# Patient Record
Sex: Female | Born: 1970 | Race: White | Hispanic: No | Marital: Married | Smoking: Never smoker
Health system: Southern US, Community
[De-identification: ages and names within clinical notes are randomized; demographics above are authoritative.]

## PROBLEM LIST (undated history)

## (undated) DIAGNOSIS — F419 Anxiety disorder, unspecified: Secondary | ICD-10-CM

## (undated) DIAGNOSIS — E781 Pure hyperglyceridemia: Secondary | ICD-10-CM

## (undated) DIAGNOSIS — E559 Vitamin D deficiency, unspecified: Secondary | ICD-10-CM

## (undated) DIAGNOSIS — K760 Fatty (change of) liver, not elsewhere classified: Secondary | ICD-10-CM

## (undated) DIAGNOSIS — K5792 Diverticulitis of intestine, part unspecified, without perforation or abscess without bleeding: Secondary | ICD-10-CM

## (undated) DIAGNOSIS — R7303 Prediabetes: Secondary | ICD-10-CM

## (undated) DIAGNOSIS — E039 Hypothyroidism, unspecified: Secondary | ICD-10-CM

## (undated) HISTORY — DX: Diverticulitis of intestine, part unspecified, without perforation or abscess without bleeding: K57.92

## (undated) HISTORY — DX: Anxiety disorder, unspecified: F41.9

## (undated) HISTORY — DX: Pure hyperglyceridemia: E78.1

## (undated) HISTORY — PX: TUBAL LIGATION: SHX77

## (undated) HISTORY — DX: Vitamin D deficiency, unspecified: E55.9

---

## 2011-10-29 DIAGNOSIS — R109 Unspecified abdominal pain: Secondary | ICD-10-CM | POA: Insufficient documentation

## 2015-12-03 ENCOUNTER — Emergency Department
Admission: EM | Admit: 2015-12-03 | Discharge: 2015-12-04 | Disposition: A | Discharge status: Home / Self Care | Payer: BC Managed Care – PPO | Attending: Emergency Medicine | Admitting: Emergency Medicine

## 2015-12-03 DIAGNOSIS — Y9389 Activity, other specified: Secondary | ICD-10-CM | POA: Insufficient documentation

## 2015-12-03 DIAGNOSIS — M436 Torticollis: Secondary | ICD-10-CM | POA: Diagnosis present

## 2015-12-03 DIAGNOSIS — Y92003 Bedroom of unspecified non-institutional (private) residence as the place of occurrence of the external cause: Secondary | ICD-10-CM | POA: Insufficient documentation

## 2015-12-03 DIAGNOSIS — M5412 Radiculopathy, cervical region: Secondary | ICD-10-CM | POA: Diagnosis not present

## 2015-12-03 DIAGNOSIS — Y999 Unspecified external cause status: Secondary | ICD-10-CM | POA: Diagnosis not present

## 2015-12-03 DIAGNOSIS — S161XXA Strain of muscle, fascia and tendon at neck level, initial encounter: Secondary | ICD-10-CM | POA: Diagnosis not present

## 2015-12-03 DIAGNOSIS — X501XXA Overexertion from prolonged static or awkward postures, initial encounter: Secondary | ICD-10-CM | POA: Diagnosis not present

## 2015-12-03 MED ORDER — KETOROLAC TROMETHAMINE 30 MG/ML IJ SOLN
30.0000 mg | Freq: Once | INTRAMUSCULAR | Status: AC
  Administered 2015-12-04: 30 mg via INTRAMUSCULAR
  Filled 2015-12-03: qty 1

## 2015-12-03 MED ORDER — NAPROXEN 500 MG PO TABS: 500.0000 mg | ORAL_TABLET | Freq: Two times a day (BID) | ORAL | Status: AC

## 2015-12-03 MED ORDER — METAXALONE 800 MG PO TABS: 800.0000 mg | ORAL_TABLET | Freq: Three times a day (TID) | ORAL | Status: AC

## 2015-12-03 MED ORDER — OXYCODONE-ACETAMINOPHEN 5-325 MG PO TABS
1.0000 | ORAL_TABLET | Freq: Four times a day (QID) | ORAL | Status: DC | PRN
Start: 2015-12-03 — End: 2018-08-04

## 2015-12-03 NOTE — ED Notes (Addendum)
Pt in with co right neck pain radiates to right arm with tingling to fingers.  Has been sitting and typing a lot recently does have hx of back problems.

## 2015-12-03 NOTE — ED Provider Notes (Signed)
[facility] Emergency Department Provider Note ____________________________________________  Time seen: Approximately 11:34 PM  I have reviewed the triage vital signs and the nursing notes.   HISTORY  Chief Complaint Torticollis    HPI Hayley Mills is a 45 y.o. female who presents to the emergency department tonight for evaluation of pain in her neck on the right side with radiation down her right arm and into her hand. She states that earlier the pain also radiated up into her scalp and around into her right jaw. 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 She states that she has not been doing anything physically taxing, but does state that she has been under a great amount of stress. She states that she took a hydrocodone but feels that this made her have an anxiety attack. She states that when she arrived to the emergency department she had very high blood pressure compared to her normal and her heart felt like it was beating out of her chest.  No past medical history on file.  There are no active problems to display for this patient.   No past surgical history on file.  Current Outpatient Rx  Name  Route  Sig  Dispense  Refill  . metaxalone (SKELAXIN) 800 MG tablet   Oral   Take 1 tablet (800 mg total) by mouth 3 (three) times daily.   90 tablet   0   . naproxen (NAPROSYN) 500 MG tablet   Oral   Take 1 tablet (500 mg total) by mouth 2 (two) times daily with a meal.   60 tablet   0   . oxyCODONE-acetaminophen (ROXICET) 5-325 MG tablet   Oral   Take 1 tablet by mouth every 6 (six) hours as needed.   12 tablet   0     Allergies Review of patient's allergies indicates no known allergies.  No family history on file.  Social History Social History  Substance Use Topics  . Smoking status: Not on file  . Smokeless tobacco: Not on file  . Alcohol  Use: Not on file    Review of Systems Constitutional: No recent illness.Positive for great amount of stress. Eyes: No visual changes. ENT: No sore throat. Cardiovascular: Denies chest pain or palpitations at time of evaluation. Respiratory: Denies shortness of breath. Gastrointestinal: No abdominal pain.  Musculoskeletal: Pain in neck Skin: Negative for rash. Neurological: Negative for headaches, focal weakness or numbness.  ____________________________________________   PHYSICAL EXAM:  VITAL SIGNS: ED Triage Vitals  Enc Vitals Group     BP 12/03/15 2123 139/89 mmHg     Pulse Rate 12/03/15 2123 80     Resp 12/03/15 2123 18     Temp 12/03/15 2123 98.2 F (36.8 C)     Temp Source 12/03/15 2123 Oral     SpO2 12/03/15 2123 99 %     Weight 12/03/15 2123 165 lb (74.844 kg)     Height 12/03/15 2123 5\' 7"  (1.702 m)     Head Cir --      Peak Flow --      Pain Score 12/03/15 2124 9     Pain Loc --      Pain Edu? --      Excl. in [place]? --     Constitutional: Alert and oriented. Well appearing and in no acute distress. Eyes: Conjunctivae are normal. EOMI. Head: Atraumatic. Nose:  No congestion/rhinnorhea. Neck: No stridor.  Respiratory: Normal respiratory effort.   Musculoskeletal: Tenderness to palpation over the cervical paraspinal muscles. Pain increases with internal rotation of the shoulders. Neurologic:  Normal speech and language. No gross focal neurologic deficits are appreciated. Speech is normal. No gait instability. Skin:  Skin is warm, dry and intact. Atraumatic. Psychiatric: Mood and affect are normal. Speech and behavior are normal.  ____________________________________________   LABS (all labs ordered are listed, but only abnormal results are displayed)  Labs Reviewed - No data to display ____________________________________________  RADIOLOGY  Not indicated ____________________________________________   PROCEDURES  Procedure(s) performed:  None   ____________________________________________   INITIAL IMPRESSION / ASSESSMENT AND PLAN / ED COURSE  Pertinent labs & imaging results that were available during my care of the patient were reviewed by me and considered in my medical decision making (see chart for details).  Patient symptoms consistent with cervical strain and cervical radiculopathy. She was given Toradol 30 mg IM while in the emergency department. She was advised to take Naprosyn 500 mg twice a day as well as Skelaxin as prescribed. She was advised to avoid sitting in a hunched over position for long periods of time. She is advised to return to the emergency department for symptoms that change or worsen if she is unable to schedule an appointment with her primary care provider. ____________________________________________   FINAL CLINICAL IMPRESSION(S) / ED DIAGNOSES  Final diagnoses:  Cervical radiculopathy  Cervical strain, acute, initial encounter       Victorino Dike, FNP 12/03/15 2344  Hinda Kehr, MD 12/03/15 2350

## 2015-12-03 NOTE — Discharge Instructions (Signed)
Cervical Radiculopathy Cervical radiculopathy happens when a nerve in the neck (cervical nerve) is pinched or bruised. This condition can develop because of an injury or as part of the normal aging process. Pressure on the cervical nerves can cause pain or numbness that runs from the neck all the way down into the arm and fingers. Usually, this condition gets better with rest. Treatment may be needed if the condition does not improve.  CAUSES This condition may be caused by:  Injury.  Slipped (herniated) disk.  Muscle tightness in the neck because of overuse.  Arthritis.  Breakdown or degeneration in the bones and joints of the spine (spondylosis) due to aging.  Bone spurs that may develop near the cervical nerves. SYMPTOMS Symptoms of this condition include:  Pain that runs from the neck to the arm and hand. The pain can be severe or irritating. It may be worse when the neck is moved.  Numbness or weakness in the affected arm and hand. DIAGNOSIS This condition may be diagnosed based on symptoms, medical history, and a physical exam. You may also have tests, including:  X-rays.  CT scan.  MRI.  Electromyogram (EMG).  Nerve conduction tests. TREATMENT In many cases, treatment is not needed for this condition. With rest, the condition usually gets better over time. If treatment is needed, options may include:  Wearing a soft neck collar for short periods of time.  Physical therapy to strengthen your neck muscles.  Medicines, such as NSAIDs, oral corticosteroids, or spinal injections.  Surgery. This may be needed if other treatments do not help. Various types of surgery may be done depending on the cause of your problems. HOME CARE INSTRUCTIONS Managing Pain  Take over-the-counter and prescription medicines only as told by your health care provider.  If directed, apply ice to the affected area.  Put ice in a plastic bag.  Place a towel between your skin and the  bag.  Leave the ice on for 20 minutes, 2-3 times per day.  If ice does not help, you can try using heat. Take a warm shower or warm bath, or use a heat pack as told by your health care provider.  Try a gentle neck and shoulder massage to help relieve symptoms. Activity  Rest as needed. Follow instructions from your health care provider about any restrictions on activities.  Do stretching and strengthening exercises as told by your health care provider or physical therapist. General Instructions  If you were given a soft collar, wear it as told by your health care provider.  Use a flat pillow when you sleep.  Keep all follow-up visits as told by your health care provider. This is important. SEEK MEDICAL CARE IF:  Your condition does not improve with treatment. SEEK IMMEDIATE MEDICAL CARE IF:  Your pain gets much worse and cannot be controlled with medicines.  You have weakness or numbness in your hand, arm, face, or leg.  You have a high fever.  You have a stiff, rigid neck.  You lose control of your bowels or your bladder (have incontinence).  You have trouble with walking, balance, or speaking.   This information is not intended to replace advice given to you by your health care provider. Make sure you discuss any questions you have with your health care provider.   Document Released: 05/20/2001 Document Revised: 05/16/2015 Document Reviewed: 10/19/2014 Elsevier Interactive Patient Education 2016 Elsevier [employer]  Acute Torticollis Torticollis is a condition in which the muscles of the neck  tighten (contract) abnormally, causing the neck to twist and the head to move into an unnatural position. Torticollis that develops suddenly is called acute torticollis. If torticollis becomes chronic and is left untreated, the face and neck can become deformed. CAUSES This condition may be caused by:  Sleeping in an awkward position (common).  Extending or twisting the neck muscles  beyond their normal position.  Infection. In some cases, the cause may not be known. SYMPTOMS Symptoms of this condition include:  An unnatural position of the head.  Neck pain.  A limited ability to move the neck.  Twisting of the neck to one side. DIAGNOSIS This condition is diagnosed with a physical exam. You may also have imaging tests, such as an X-ray, CT scan, or MRI. TREATMENT Treatment for this condition involves trying to relax the neck muscles. It may include:  Medicines or shots.  Physical therapy.  Surgery. This may be done in severe cases. HOME CARE INSTRUCTIONS  Take medicines only as directed by your health care provider.  Do stretching exercises and massage your neck as directed by your health care provider.  Keep all follow-up visits as directed by your health care provider. This is important. SEEK MEDICAL CARE IF:  You develop a fever. SEEK IMMEDIATE MEDICAL CARE IF:  You develop difficulty breathing.  You develop noisy breathing (stridor).  You start drooling.  You have trouble swallowing or have pain with swallowing.  You develop numbness or weakness in your hands or feet.  You have changes in your speech, understanding, or vision.  Your pain gets worse.   This information is not intended to replace advice given to you by your health care provider. Make sure you discuss any questions you have with your health care provider.   Document Released: 08/22/2000 Document Revised: 01/09/2015 Document Reviewed: 08/21/2014 Elsevier Interactive Patient Education [employer].

## 2017-04-02 ENCOUNTER — Encounter: Payer: Self-pay | Admitting: Obstetrics & Gynecology

## 2017-04-02 ENCOUNTER — Ambulatory Visit: Payer: Self-pay | Admitting: Obstetrics & Gynecology

## 2017-04-02 ENCOUNTER — Ambulatory Visit (INDEPENDENT_AMBULATORY_CARE_PROVIDER_SITE_OTHER): Payer: BC Managed Care – PPO | Admitting: Obstetrics & Gynecology

## 2017-04-02 VITALS — BP 110/70 | HR 93 | Ht 67.0 in | Wt 196.0 lb

## 2017-04-02 DIAGNOSIS — R1032 Left lower quadrant pain: Secondary | ICD-10-CM | POA: Diagnosis not present

## 2017-04-02 DIAGNOSIS — Z1211 Encounter for screening for malignant neoplasm of colon: Secondary | ICD-10-CM | POA: Diagnosis not present

## 2017-04-02 DIAGNOSIS — Z01419 Encounter for gynecological examination (general) (routine) without abnormal findings: Secondary | ICD-10-CM | POA: Diagnosis not present

## 2017-04-02 DIAGNOSIS — Z1231 Encounter for screening mammogram for malignant neoplasm of breast: Secondary | ICD-10-CM

## 2017-04-02 DIAGNOSIS — Z0000 Encounter for general adult medical examination without abnormal findings: Secondary | ICD-10-CM

## 2017-04-02 DIAGNOSIS — Z124 Encounter for screening for malignant neoplasm of cervix: Secondary | ICD-10-CM

## 2017-04-02 DIAGNOSIS — Z1239 Encounter for other screening for malignant neoplasm of breast: Secondary | ICD-10-CM

## 2017-04-02 MED ORDER — LEVOTHYROXINE SODIUM 50 MCG PO TABS: 50.0000 ug | ORAL_TABLET | Freq: Every day | ORAL | 11 refills | Status: DC

## 2017-04-02 NOTE — Patient Instructions (Signed)
PAP every three years Mammogram every year    Call [phone] to schedule at [facility] Colonoscopy every 10 years Labs next year

## 2017-04-02 NOTE — Progress Notes (Addendum)
HPI:      Hayley Mills is a 46 y.o. [phone] who LMP was Patient's last menstrual period was 03/20/2017., she presents today for her annual examination. The patient has no complaints today. The patient is sexually active. Her last pap: approximate date 2015 and was normal and last mammogram: approximate date 2017 and was normal. The patient does perform self breast exams.  There is no notable family history of breast or ovarian cancer in her family.  The patient has regular exercise: yes.  The patient denies current symptoms of depression.    GYN History: Contraception: tubal ligation  PMHx: Past Medical History:  Diagnosis Date  . Anxiety   . Hypertriglyceridemia   . Vitamin D deficiency    Past Surgical History:  Procedure Laterality Date  . CESAREAN SECTION    . TUBAL LIGATION     Family History  Problem Relation Age of Onset  . Diabetes Father   . Hypertension Father   . Kidney cancer Father   . Breast cancer Paternal Grandmother    Social History  Substance Use Topics  . Smoking status: Never Smoker  . Smokeless tobacco: Never Used  . Alcohol use No    Current Outpatient Prescriptions:  .  oxyCODONE-acetaminophen (ROXICET) 5-325 MG tablet, Take 1 tablet by mouth every 6 (six) hours as needed., Disp: 12 tablet, Rfl: 0 Allergies: Patient has no known allergies.  Review of Systems  Constitutional: Negative for chills, fever and malaise/fatigue.  HENT: Negative for congestion, sinus pain and sore throat.   Eyes: Negative for blurred vision and pain.  Respiratory: Negative for cough and wheezing.   Cardiovascular: Negative for chest pain and leg swelling.  Gastrointestinal: Negative for abdominal pain, constipation, diarrhea, heartburn, nausea and vomiting.  Genitourinary: Negative for dysuria, frequency, hematuria and urgency.  Musculoskeletal: Negative for back pain, joint pain, myalgias and neck pain.  Skin: Negative for itching and rash.    Neurological: Negative for dizziness, tremors and weakness.  Endo/Heme/Allergies: Does not bruise/bleed easily.  Psychiatric/Behavioral: Negative for depression. The patient is not nervous/anxious and does not have insomnia.     Objective: BP 110/70   Pulse 93   Ht 5\' 7"  (1.702 m)   Wt 196 lb (88.9 kg)   LMP 03/20/2017   BMI 30.70 kg/m   Filed Weights   04/02/17 1545  Weight: 196 lb (88.9 kg)   Body mass index is 30.7 kg/m. Physical Exam  Constitutional: She is oriented to person, place, and time. She appears well-developed and well-nourished. No distress.  Genitourinary: Rectum normal, vagina normal and uterus normal. Pelvic exam was performed with patient supine. There is no rash or lesion on the right labia. There is no rash or lesion on the left labia. Vagina exhibits no lesion. No bleeding in the vagina. Right adnexum does not display mass and does not display tenderness. Left adnexum does not display mass and does not display tenderness. Cervix does not exhibit motion tenderness, lesion, friability or polyp.   Uterus is mobile and midaxial. Uterus is not enlarged or exhibiting a mass.  HENT:  Head: Normocephalic and atraumatic. Head is without laceration.  Right Ear: Hearing normal.  Left Ear: Hearing normal.  Nose: No epistaxis.  No foreign bodies.  Mouth/Throat: Uvula is midline, oropharynx is clear and moist and mucous membranes are normal.  Eyes: Pupils are equal, round, and reactive to light.  Neck: Normal range of motion. Neck supple. No thyromegaly present.  Cardiovascular: Normal rate and regular rhythm.  Exam reveals no gallop and no friction rub.   No murmur heard. Pulmonary/Chest: Effort normal and breath sounds normal. No respiratory distress. She has no wheezes. Right breast exhibits no mass, no skin change and no tenderness. Left breast exhibits no mass, no skin change and no tenderness.  Abdominal: Soft. Bowel sounds are normal. She exhibits no distension.  There is no tenderness. There is no rebound.  Musculoskeletal: Normal range of motion.  Neurological: She is alert and oriented to person, place, and time. No cranial nerve deficit.  Skin: Skin is warm and dry.  Psychiatric: She has a normal mood and affect. Judgment normal.  Vitals reviewed.   Assessment:  ANNUAL EXAM 1. Annual physical exam   2. Screening for cervical cancer   3. Screening for breast cancer   4. Screen for colon cancer   5. Left lower quadrant pain      Screening Plan:            1.  Cervical Screening-  Pap smear done today  2. Breast screening- Exam annually and mammogram>40 planned   3. Colonoscopy every 10 years, Hemoccult testing - after age 31  4. Labs due next year  5. Counseling for contraception: bilateral tubal ligation  Other:  1. Annual physical exam  2. Screening for cervical cancer - IGP, Aptima HPV  3. Screening for breast cancer - MM DIGITAL SCREENING BILATERAL; Future  4. Screen for colon cancer - Ambulatory referral to Gastroenterology  5. Left lower quadrant pain - US Transvaginal Non-OB; Future      F/U  Return in about 2 weeks (around 04/16/2017) for Follow up w GYN [place].  Barnett Applebaum, MD, [facility], [facility] 04/02/2017  5:14 PM

## 2017-04-02 NOTE — Addendum Note (Signed)
Addended by: Gae Dry on: 04/02/2017 05:41 PM   Modules accepted: Orders

## 2017-04-07 ENCOUNTER — Ambulatory Visit
Admission: RE | Admit: 2017-04-07 | Discharge: 2017-04-07 | Disposition: A | Discharge status: Home / Self Care | Payer: BC Managed Care – PPO | Source: Ambulatory Visit | Attending: Obstetrics & Gynecology | Admitting: Obstetrics & Gynecology

## 2017-04-07 DIAGNOSIS — Z1231 Encounter for screening mammogram for malignant neoplasm of breast: Secondary | ICD-10-CM | POA: Diagnosis present

## 2017-04-07 DIAGNOSIS — Z1239 Encounter for other screening for malignant neoplasm of breast: Secondary | ICD-10-CM

## 2017-04-07 DIAGNOSIS — N6320 Unspecified lump in the left breast, unspecified quadrant: Secondary | ICD-10-CM | POA: Diagnosis not present

## 2017-04-07 DIAGNOSIS — R928 Other abnormal and inconclusive findings on diagnostic imaging of breast: Secondary | ICD-10-CM | POA: Diagnosis not present

## 2017-04-07 LAB — IGP, APTIMA HPV
HPV Aptima: NEGATIVE
PAP Smear Comment: 0

## 2017-04-09 ENCOUNTER — Other Ambulatory Visit: Payer: Self-pay | Admitting: *Deleted

## 2017-04-09 ENCOUNTER — Inpatient Hospital Stay
Admission: RE | Admit: 2017-04-09 | Discharge: 2017-04-09 | Disposition: A | Discharge status: Home / Self Care | Payer: Self-pay | Source: Ambulatory Visit | Attending: *Deleted | Admitting: *Deleted

## 2017-04-09 DIAGNOSIS — Z9289 Personal history of other medical treatment: Secondary | ICD-10-CM

## 2017-04-13 ENCOUNTER — Other Ambulatory Visit: Payer: Self-pay | Admitting: Obstetrics & Gynecology

## 2017-04-13 ENCOUNTER — Encounter: Payer: Self-pay | Admitting: Obstetrics & Gynecology

## 2017-04-13 DIAGNOSIS — R928 Other abnormal and inconclusive findings on diagnostic imaging of breast: Secondary | ICD-10-CM

## 2017-04-27 ENCOUNTER — Telehealth: Payer: Self-pay

## 2017-04-27 ENCOUNTER — Other Ambulatory Visit: Payer: Self-pay | Admitting: Obstetrics & Gynecology

## 2017-04-27 ENCOUNTER — Ambulatory Visit
Admission: RE | Admit: 2017-04-27 | Discharge: 2017-04-27 | Disposition: A | Discharge status: Home / Self Care | Payer: BC Managed Care – PPO | Source: Ambulatory Visit | Attending: Obstetrics & Gynecology | Admitting: Obstetrics & Gynecology

## 2017-04-27 DIAGNOSIS — R928 Other abnormal and inconclusive findings on diagnostic imaging of breast: Secondary | ICD-10-CM | POA: Diagnosis present

## 2017-04-27 DIAGNOSIS — N6320 Unspecified lump in the left breast, unspecified quadrant: Secondary | ICD-10-CM

## 2017-04-27 NOTE — Telephone Encounter (Signed)
Pt returning PH's call.  [phone]

## 2017-04-28 ENCOUNTER — Ambulatory Visit
Admission: RE | Admit: 2017-04-28 | Discharge: 2017-04-28 | Disposition: A | Discharge status: Home / Self Care | Payer: BC Managed Care – PPO | Source: Ambulatory Visit | Attending: Obstetrics & Gynecology | Admitting: Obstetrics & Gynecology

## 2017-04-28 DIAGNOSIS — N6322 Unspecified lump in the left breast, upper inner quadrant: Secondary | ICD-10-CM | POA: Diagnosis not present

## 2017-04-28 DIAGNOSIS — N6320 Unspecified lump in the left breast, unspecified quadrant: Secondary | ICD-10-CM

## 2017-04-28 DIAGNOSIS — R928 Other abnormal and inconclusive findings on diagnostic imaging of breast: Secondary | ICD-10-CM | POA: Diagnosis present

## 2017-04-28 DIAGNOSIS — N6089 Other benign mammary dysplasias of unspecified breast: Secondary | ICD-10-CM | POA: Diagnosis not present

## 2017-04-28 HISTORY — PX: BREAST BIOPSY: SHX20

## 2017-04-28 NOTE — Telephone Encounter (Signed)
D/w pt.  Biopsy 04/28/17

## 2017-04-28 NOTE — Telephone Encounter (Signed)
Pt called triage line stating she and [facility] have been playing phone tag. She would like to discuss the biopsy they talked about. She received a notice from [place] stating it could be a week and she thinks that is to long because she knows there is something going on. CB# [phone]

## 2017-04-29 ENCOUNTER — Ambulatory Visit (INDEPENDENT_AMBULATORY_CARE_PROVIDER_SITE_OTHER): Payer: BC Managed Care – PPO | Admitting: Obstetrics & Gynecology

## 2017-04-29 ENCOUNTER — Encounter: Payer: Self-pay | Admitting: Obstetrics & Gynecology

## 2017-04-29 ENCOUNTER — Ambulatory Visit (INDEPENDENT_AMBULATORY_CARE_PROVIDER_SITE_OTHER): Payer: BC Managed Care – PPO

## 2017-04-29 VITALS — BP 130/90 | HR 83 | Ht 67.0 in | Wt 198.0 lb

## 2017-04-29 DIAGNOSIS — R635 Abnormal weight gain: Secondary | ICD-10-CM | POA: Diagnosis not present

## 2017-04-29 DIAGNOSIS — Z1329 Encounter for screening for other suspected endocrine disorder: Secondary | ICD-10-CM

## 2017-04-29 DIAGNOSIS — Z1321 Encounter for screening for nutritional disorder: Secondary | ICD-10-CM

## 2017-04-29 DIAGNOSIS — R1032 Left lower quadrant pain: Secondary | ICD-10-CM | POA: Diagnosis not present

## 2017-04-29 DIAGNOSIS — E559 Vitamin D deficiency, unspecified: Secondary | ICD-10-CM | POA: Diagnosis not present

## 2017-04-29 DIAGNOSIS — Z131 Encounter for screening for diabetes mellitus: Secondary | ICD-10-CM | POA: Diagnosis not present

## 2017-04-29 DIAGNOSIS — Z13220 Encounter for screening for lipoid disorders: Secondary | ICD-10-CM

## 2017-04-29 DIAGNOSIS — Z6831 Body mass index (BMI) 31.0-31.9, adult: Secondary | ICD-10-CM | POA: Insufficient documentation

## 2017-04-29 LAB — SURGICAL PATHOLOGY

## 2017-04-29 MED ORDER — NALTREXONE-BUPROPION HCL ER 8-90 MG PO TB12: ORAL_TABLET | ORAL | 0 refills | Status: DC

## 2017-04-29 NOTE — Progress Notes (Signed)
  HPI:Pt has had ongoing mild left lower quadrant pain, no radiaiton or assoc sx's, no modifers.  Also has gained weight over the past year (surgery), has done well w Contrave in past.  Joint achiness.  Ultrasound demonstrates no masses seen, no cysts These findings are Pelvis normal  PMHx: She  has a past medical history of Anxiety; Hypertriglyceridemia; and Vitamin D deficiency. Also,  has a past surgical history that includes Cesarean section; Tubal ligation; and Breast biopsy (Left, 04/28/2017)., family history includes Breast cancer (age of onset: 32) in her paternal grandmother; Diabetes in her father; Hypertension in her father; Kidney cancer in her father.,  reports that she has never smoked. She has never used smokeless tobacco. She reports that she does not drink alcohol or use drugs.  She has a current medication list which includes the following prescription(s): levothyroxine, naltrexone-bupropion hcl er, and oxycodone-acetaminophen. Also, has No Known Allergies.  Review of Systems  All other systems reviewed and are negative.  Objective: BP 130/90   Pulse 83   Ht 5\' 7"  (1.702 m)   Wt 198 lb (89.8 kg)   BMI 31.01 kg/m   Physical examination Constitutional NAD, Conversant  Skin No rashes, lesions or ulceration.   Extremities: Moves all appropriately.  Normal ROM for age. No lymphadenopathy.  Neuro: Grossly intact  Psych: Oriented to PPT.  Normal mood. Normal affect.   Assessment:  Left lower quadrant pain    No signs of GYN etiology.  Adhesions, GI, MS, other.  Weight gain - Plan: Lipid panel  BMI 31.0-31.9,adult - Plan: Lipid panel  Vitamin D deficiency - Plan: VITAMIN D 25 Hydroxy (Vit-D Deficiency, Fractures)  Screening for thyroid disorder - Plan: TSH  Screening for diabetes mellitus - Plan: Hemoglobin A1c  Screening for cholesterol level - Plan: Lipid panel  Encounter for vitamin deficiency screening - Plan: Vitamin B12  Start Contrave and follow  up. Info gv, pros and cons discussed.  Barnett Applebaum, MD, [facility], [facility] 04/29/2017  11:24 AM

## 2017-04-29 NOTE — Patient Instructions (Signed)
Bupropion; Naltrexone extended-release tablets What is this medicine? BUPROPION; NALTREXONE (byoo PROE pee on; nal TREX one) is a combination product used to promote and maintain weight loss in obese adults or overweight adults who also have weight related medical problems. This medicine should be used with a reduced calorie diet and increased physical activity. This medicine may be used for other purposes; ask your health care provider or pharmacist if you have questions. COMMON BRAND NAME(S): CONTRAVE What should I tell my health care provider before I take this medicine? They need to know if you have any of these conditions: -an eating disorder, such as anorexia or bulimia -bipolar disorder -diabetes -depression -drug abuse or addiction -glaucoma -head injury -heart disease -high blood pressure -history of a tumor or infection of your brain or spine -history of stroke -history of irregular heartbeat -if you often drink alcohol -kidney disease -liver disease -schizophrenia -seizures -suicidal thoughts, plans, or attempt; a previous suicide attempt by you or a family member -an unusual or allergic reaction to bupropion, naltrexone, other medicines, foods, dyes, or preservatives -breast-feeding -pregnant or trying to become pregnant How should I use this medicine? Take this medicine by mouth with a glass of water. Follow the directions on the prescription label. Take this medicine in the morning and in the evenings as directed by your healthcare professional. You can take it with or without food. Do not take with high-fat meals as this may increase your risk of seizures. Do not crush, chew, or cut these tablets. Do not take your medicine more often than directed. Do not stop taking this medicine suddenly except upon the advice of your doctor. A special MedGuide will be given to you by the pharmacist with each prescription and refill. Be sure to read this information carefully each  time. Talk to your pediatrician regarding the use of this medicine in children. Special care may be needed. Overdosage: If you think you have taken too much of this medicine contact a poison control center or emergency room at once. NOTE: This medicine is only for you. Do not share this medicine with others. What if I miss a dose? If you miss a dose, skip the missed dose and take your next tablet at the regular time. Do not take double or extra doses. What may interact with this medicine? Do not take this medicine with any of the following medications: -any prescription or street opioid drug like codeine, heroin, methadone -linezolid -MAOIs like Carbex, Eldepryl, Marplan, Nardil, and Parnate -methylene blue (injected into a vein) -other medicines that contain bupropion like Zyban or Wellbutrin This medicine may also interact with the following medications: -alcohol -certain medicines for anxiety or sleep -certain medicines for blood pressure like metoprolol, propranolol -certain medicines for depression or psychotic disturbances -certain medicines for HIV or AIDS like efavirenz, lopinavir, nelfinavir, ritonavir -certain medicines for irregular heart beat like propafenone, flecainide -certain medicines for Parkinson's disease like amantadine, levodopa -certain medicines for seizures like carbamazepine, phenytoin, phenobarbital -cimetidine -clopidogrel -cyclophosphamide -digoxin -disulfiram -furazolidone -isoniazid -nicotine -orphenadrine -procarbazine -steroid medicines like prednisone or cortisone -stimulant medicines for attention disorders, weight loss, or to stay awake -tamoxifen -theophylline -thioridazine -thiotepa -ticlopidine -tramadol -warfarin This list may not describe all possible interactions. Give your health care provider a list of all the medicines, herbs, non-prescription drugs, or dietary supplements you use. Also tell them if you smoke, drink alcohol, or use  illegal drugs. Some items may interact with your medicine. What should I watch for while using this   medicine? This medicine is intended to be used in addition to a healthy diet and appropriate exercise. The best results are achieved this way. Do not increase or in any way change your dose without consulting your doctor or health care professional. Do not take this medicine with other prescription or over-the-counter weight loss products without consulting your doctor or health care professional. Your doctor should tell you to stop taking this medicine if you do not lose a certain amount of weight within the first 12 weeks of treatment. Visit your doctor or health care professional for regular checkups. Your doctor may order blood tests or other tests to see how you are doing. This medicine may affect blood sugar levels. If you have diabetes, check with your doctor or health care professional before you change your diet or the dose of your diabetic medicine. Patients and their families should watch out for new or worsening depression or thoughts of suicide. Also watch out for sudden changes in feelings such as feeling anxious, agitated, panicky, irritable, hostile, aggressive, impulsive, severely restless, overly excited and hyperactive, or not being able to sleep. If this happens, especially at the beginning of treatment or after a change in dose, call your health care professional. Avoid alcoholic drinks while taking this medicine. Drinking large amounts of alcoholic beverages, using sleeping or anxiety medicines, or quickly stopping the use of these agents while taking this medicine may increase your risk for a seizure. What side effects may I notice from receiving this medicine? Side effects that you should report to your doctor or health care professional as soon as possible: -allergic reactions like skin rash, itching or hives, swelling of the face, lips, or tongue -breathing problems -changes in  vision -confusion -elevated mood, decreased need for sleep, racing thoughts, impulsive behavior -fast or irregular heartbeat -hallucinations, loss of contact with reality -increased blood pressure -redness, blistering, peeling or loosening of the skin, including inside the mouth -seizures -signs and symptoms of liver injury like dark yellow or brown urine; general ill feeling or flu-like symptoms; light-colored stools; loss of appetite; nausea; right upper belly pain; unusually weak or tired; yellowing of the eyes or skin -suicidal thoughts or other mood changes -vomiting Side effects that usually do not require medical attention (report to your doctor or health care professional if they continue or are bothersome): -constipation -headache -loss of appetite -indigestion, stomach upset -tremors This list may not describe all possible side effects. Call your doctor for medical advice about side effects. You may report side effects to FDA at 1-800-FDA-1088. Where should I keep my medicine? Keep out of the reach of children. Store at room temperature between 15 and 30 degrees C (59 and 86 degrees F). Throw away any unused medicine after the expiration date. NOTE: This sheet is a summary. It may not cover all possible information. If you have questions about this medicine, talk to your doctor, pharmacist, or health care provider.  2018 Elsevier/Gold Standard (2016-02-15 13:42:58)  

## 2017-05-01 LAB — LIPID PANEL
CHOLESTEROL TOTAL: 189 mg/dL (ref 100–199)
Chol/HDL Ratio: 6.1 ratio — ABNORMAL HIGH (ref 0.0–4.4)
HDL: 31 mg/dL — ABNORMAL LOW (ref >39)
LDL Calculated: 111 mg/dL — ABNORMAL HIGH (ref 0–99)
Triglycerides: 234 mg/dL — ABNORMAL HIGH (ref 0–149)
VLDL CHOLESTEROL CAL: 47 mg/dL — AB (ref 5–40)

## 2017-05-01 LAB — VITAMIN B12: VITAMIN B 12: 460 pg/mL (ref 232–1245)

## 2017-05-01 LAB — HEMOGLOBIN A1C
ESTIMATED AVERAGE GLUCOSE: 123 mg/dL
Hgb A1c MFr Bld: 5.9 % — ABNORMAL HIGH (ref 4.8–5.6)

## 2017-05-01 LAB — TSH: TSH: 5.89 u[IU]/mL — AB (ref 0.450–4.500)

## 2017-05-01 LAB — VITAMIN D 25 HYDROXY (VIT D DEFICIENCY, FRACTURES): VIT D 25 HYDROXY: 36.8 ng/mL (ref 30.0–100.0)

## 2017-05-02 ENCOUNTER — Encounter: Payer: Self-pay | Admitting: Obstetrics & Gynecology

## 2017-05-04 ENCOUNTER — Other Ambulatory Visit: Payer: Self-pay | Admitting: Obstetrics & Gynecology

## 2017-05-04 MED ORDER — LEVOTHYROXINE SODIUM 88 MCG PO TABS: 88.0000 ug | ORAL_TABLET | Freq: Every day | ORAL | 11 refills | Status: DC

## 2017-05-04 NOTE — Progress Notes (Signed)
D/w pt.  THYROID- change dose to 88 mcg daily, recheck level in 2 mos.  Chol (trigly)- Modifiy diet, lose weight, start Omega 3 fa.  HgbA1C- monitor diet, lose weight, recheck.

## 2017-05-06 ENCOUNTER — Telehealth: Payer: Self-pay

## 2017-05-06 ENCOUNTER — Other Ambulatory Visit: Payer: Self-pay

## 2017-05-06 DIAGNOSIS — Z6830 Body mass index (BMI) 30.0-30.9, adult: Principal | ICD-10-CM

## 2017-05-06 DIAGNOSIS — E669 Obesity, unspecified: Secondary | ICD-10-CM

## 2017-05-06 MED ORDER — NALTREXONE-BUPROPION HCL ER 8-90 MG PO TB12: ORAL_TABLET | ORAL | 0 refills | Status: DC

## 2017-05-06 NOTE — Telephone Encounter (Signed)
Called pt to inform, number not in service, will try again later, as I just spoke with pt today with this number

## 2017-05-06 NOTE — Telephone Encounter (Signed)
Pt waiting on prior auth on [facility] [address]  They have sent two requests c no response.  Was it sent [facility] a [facility] pharm [facility] [employer]? Has is been sent to ins.?  [phone]

## 2017-05-06 NOTE — Telephone Encounter (Signed)
Pt correct, I seen results from past PA, new PA submitted today

## 2017-05-06 NOTE — Telephone Encounter (Signed)
Pt thinks there is some kind of mix up about her contrave and prior auth.  The pharm doesn't have prior auth on contrave.  Pt received rx, took rx to [place] no prior [place], insurance would not approve it, she called [place], was told prior Josem Kaufmann was ready and talked about she needed to p/u rx )at the time she didn't think to say she already had rx), [facility] and told them it was approved, they said it was not approved.  Pt doesn't know what to do.  Please call and help her get this straightened out.  [phone]

## 2017-05-07 NOTE — Telephone Encounter (Signed)
lmtrc

## 2017-05-08 NOTE — Telephone Encounter (Signed)
Pt wants to know can you send in her Levothyroxine for 3 months at a time.

## 2017-05-11 MED ORDER — LEVOTHYROXINE SODIUM 88 MCG PO TABS: 88.0000 ug | ORAL_TABLET | Freq: Every day | ORAL | 3 refills | Status: DC

## 2017-05-11 NOTE — Telephone Encounter (Signed)
Done

## 2017-06-05 ENCOUNTER — Other Ambulatory Visit: Payer: Self-pay | Admitting: Obstetrics & Gynecology

## 2017-06-05 DIAGNOSIS — E669 Obesity, unspecified: Secondary | ICD-10-CM

## 2017-06-05 DIAGNOSIS — Z6830 Body mass index (BMI) 30.0-30.9, adult: Principal | ICD-10-CM

## 2017-06-05 MED ORDER — NALTREXONE-BUPROPION HCL ER 8-90 MG PO TB12: 2.0000 | ORAL_TABLET | Freq: Two times a day (BID) | ORAL | 1 refills | Status: DC

## 2017-06-08 ENCOUNTER — Telehealth: Payer: Self-pay

## 2017-06-08 NOTE — Telephone Encounter (Signed)
Pt calling for refill of [facility] [address] as she will be out tonight and is on her maintenance dose.  Does she need to p/u rx here or can it be sent to pharm?  [phone]  Left detailed msg that refill was eRx'd Fri.  To ck c pharm.  If problems to call us back.

## 2017-06-11 ENCOUNTER — Other Ambulatory Visit: Payer: Self-pay | Admitting: Obstetrics & Gynecology

## 2017-07-31 ENCOUNTER — Telehealth: Payer: Self-pay | Admitting: Obstetrics & Gynecology

## 2017-08-03 NOTE — Telephone Encounter (Signed)
Needs appt.  Refill authorized for now, but would like to see pt and monitor weight, blood pressure, and progress.

## 2017-08-03 NOTE — Telephone Encounter (Signed)
Called and spoke with patient about scheduling appointment. Pt will call back once she has her work schedule

## 2017-08-11 ENCOUNTER — Ambulatory Visit: Payer: BC Managed Care – PPO | Admitting: Obstetrics & Gynecology

## 2017-08-12 ENCOUNTER — Other Ambulatory Visit: Payer: BC Managed Care – PPO

## 2017-08-12 ENCOUNTER — Ambulatory Visit (INDEPENDENT_AMBULATORY_CARE_PROVIDER_SITE_OTHER): Payer: BC Managed Care – PPO | Admitting: Obstetrics & Gynecology

## 2017-08-12 ENCOUNTER — Encounter: Payer: Self-pay | Admitting: Obstetrics & Gynecology

## 2017-08-12 VITALS — BP 120/80 | HR 95 | Ht 68.0 in | Wt 194.0 lb

## 2017-08-12 DIAGNOSIS — E663 Overweight: Secondary | ICD-10-CM

## 2017-08-12 DIAGNOSIS — Z6829 Body mass index (BMI) 29.0-29.9, adult: Secondary | ICD-10-CM

## 2017-08-12 DIAGNOSIS — E039 Hypothyroidism, unspecified: Secondary | ICD-10-CM | POA: Diagnosis not present

## 2017-08-12 MED ORDER — NALTREXONE-BUPROPION HCL ER 8-90 MG PO TB12: 2.0000 | ORAL_TABLET | Freq: Two times a day (BID) | ORAL | 9 refills | Status: DC

## 2017-08-12 NOTE — Progress Notes (Signed)
History of Present Illness:  Hayley Mills is a 46 y.o. who was started on  Current Outpatient Medications on File Prior to Visit  Medication Sig Dispense Refill  . levothyroxine (SYNTHROID) 88 MCG tablet Take 1 tablet (88 mcg total) by mouth daily before breakfast. 90 tablet 3  . Contrave 2 pills BID 120 0  approximately 2 months ago due to obesity/abnormal weight gain, and due to low Thyroid testing levels.   The patient has lost 4 pounds over the past 2 mos due to lifestyle changes and Contrave meds (and she feels better on meds even though weight loss has been slow so far; has done well w this med in past also)...   She has these side effects: none.  Also feels better energy with the changed thyroid dose.  PMHx: She  has a past medical history of Anxiety, Hypertriglyceridemia, and Vitamin D deficiency. Also,  has a past surgical history that includes Cesarean section; Tubal ligation; and Breast biopsy (Left, 04/28/2017)., family history includes Breast cancer (age of onset: 9) in her paternal grandmother; Diabetes in her father; Hypertension in her father; Kidney cancer in her father.,  reports that  has never smoked. she has never used smokeless tobacco. She reports that she does not drink alcohol or use drugs.  She has a current medication list which includes the following prescription(s): levothyroxine, naltrexone-bupropion hcl er, and oxycodone-acetaminophen. Also, has No Known Allergies.  Review of Systems  Constitutional: Negative for chills, fever and malaise/fatigue.  HENT: Negative for congestion, sinus pain and sore throat.   Eyes: Negative for blurred vision and pain.  Respiratory: Negative for cough and wheezing.   Cardiovascular: Negative for chest pain and leg swelling.  Gastrointestinal: Negative for abdominal pain, constipation, diarrhea, heartburn, nausea and vomiting.  Genitourinary: Negative for dysuria, frequency, hematuria and urgency.  Musculoskeletal:  Negative for back pain, joint pain, myalgias and neck pain.  Skin: Negative for itching and rash.  Neurological: Negative for dizziness, tremors and weakness.  Endo/Heme/Allergies: Does not bruise/bleed easily.  Psychiatric/Behavioral: Negative for depression. The patient is not nervous/anxious and does not have insomnia.    Physical Exam:  BP 120/80   Pulse 95   Ht 5\' 8"  (1.727 m)   Wt 194 lb (88 kg)   BMI 29.50 kg/m  Body mass index is 29.5 kg/m. Filed Weights   08/12/17 0819  Weight: 194 lb (88 kg)   Physical Exam  Constitutional: She is oriented to person, place, and time. She appears well-developed and well-nourished. No distress.  Musculoskeletal: Normal range of motion.  Neurological: She is alert and oriented to person, place, and time.  Skin: Skin is warm and dry.  Psychiatric: She has a normal mood and affect.  Vitals reviewed.   Assessment: overweight, BMI 29, hypothyroidism Medication treatment is going very well for her.  Plan: Patient will be continued/added to prescription appetite suppressants: Contrave, self-directed dieting and exercise.  Also will have TSH checked today and cont current dose..   Will continue to assist patient in incorporating positive experiences into her life to promote a positive mental attitude.  Education given regarding appropriate lifestyle changes for weight loss, including regular physical activity, healthy coping strategies, caloric restriction, and healthy eating patterns.  The risks and benefits as well as side effects of medication is discussed.  The pros and cons of suppressing appetite and boosting metabolism is counseled.  Pt to call with any negative side effects and agrees to keep follow up appointments.  A total  of 15 minutes were spent face-to-face with the patient during this encounter and over half of that time dealt with counseling and coordination of care.  Barnett Applebaum, MD, [facility], [facility] 08/12/2017  8:34 AM

## 2017-08-13 LAB — TSH: TSH: 1.71 u[IU]/mL (ref 0.450–4.500)

## 2017-08-13 NOTE — Progress Notes (Signed)
NA, mailbox full; TSH normal no change in dose of synthroid.  Will cont to call.

## 2017-08-14 NOTE — Progress Notes (Signed)
Please try to call patient occas to let her know that thyroid normal and cont current dose of thyroid medicine.

## 2017-09-23 ENCOUNTER — Other Ambulatory Visit: Payer: Self-pay | Admitting: Obstetrics & Gynecology

## 2017-09-23 ENCOUNTER — Telehealth: Payer: Self-pay

## 2017-09-23 MED ORDER — SULFAMETHOXAZOLE-TRIMETHOPRIM 800-160 MG PO TABS: 1.0000 | ORAL_TABLET | Freq: Two times a day (BID) | ORAL | 0 refills | Status: DC

## 2017-09-23 NOTE — Telephone Encounter (Signed)
Pt seen & treated by [facility] last week for UTI. She took abx & it is better but not completely gone. She called them back yesterday to report. Dr stated that her culture didn't show bacteria & she needs to go to Urology. Pt states [facility] is aware of her frequent UTI's. Pt inquiring if [facility] is able to access records from visit last week & let her know what he thinks. WQ#[phone]

## 2017-09-23 NOTE — Telephone Encounter (Signed)
Not able to access those records Recurrent UTI can be evaluated by urology of it continues to be a problem for her. Can make referral if desired.

## 2017-09-23 NOTE — Telephone Encounter (Signed)
Pt aware.

## 2017-09-23 NOTE — Telephone Encounter (Signed)
Pt states she needs a another round of antibiotic it never clears with one dose, haven't had one in 3 years , just cost 90 dollars if she is seen here, Please advise. States the uti is 89% better, states if she can try one more round and it clears up than good

## 2017-09-23 NOTE — Telephone Encounter (Signed)
lvmtrc  

## 2017-09-23 NOTE — Telephone Encounter (Signed)
ERx sent for Bactrim (3 days tx)

## 2017-10-12 ENCOUNTER — Ambulatory Visit: Payer: Self-pay

## 2018-02-23 ENCOUNTER — Other Ambulatory Visit: Payer: Self-pay | Admitting: Obstetrics & Gynecology

## 2018-02-23 NOTE — Telephone Encounter (Signed)
Are you refilling this or should pt see her pcp

## 2018-06-25 ENCOUNTER — Other Ambulatory Visit: Payer: Self-pay | Admitting: Obstetrics & Gynecology

## 2018-06-25 DIAGNOSIS — Z1231 Encounter for screening mammogram for malignant neoplasm of breast: Secondary | ICD-10-CM

## 2018-07-05 ENCOUNTER — Other Ambulatory Visit: Payer: Self-pay | Admitting: Obstetrics & Gynecology

## 2018-07-16 ENCOUNTER — Encounter: Payer: Self-pay | Admitting: Radiology

## 2018-07-16 ENCOUNTER — Ambulatory Visit
Admission: RE | Admit: 2018-07-16 | Discharge: 2018-07-16 | Disposition: A | Discharge status: Home / Self Care | Payer: BC Managed Care – PPO | Source: Ambulatory Visit | Attending: Obstetrics & Gynecology | Admitting: Obstetrics & Gynecology

## 2018-07-16 DIAGNOSIS — Z1231 Encounter for screening mammogram for malignant neoplasm of breast: Secondary | ICD-10-CM

## 2018-07-19 ENCOUNTER — Encounter: Payer: Self-pay | Admitting: Obstetrics & Gynecology

## 2018-08-04 ENCOUNTER — Ambulatory Visit (INDEPENDENT_AMBULATORY_CARE_PROVIDER_SITE_OTHER): Payer: BC Managed Care – PPO | Admitting: Obstetrics & Gynecology

## 2018-08-04 ENCOUNTER — Encounter: Payer: Self-pay | Admitting: Obstetrics & Gynecology

## 2018-08-04 VITALS — BP 120/60 | Ht 68.0 in | Wt 174.0 lb

## 2018-08-04 DIAGNOSIS — Z0000 Encounter for general adult medical examination without abnormal findings: Secondary | ICD-10-CM | POA: Diagnosis not present

## 2018-08-04 DIAGNOSIS — Z23 Encounter for immunization: Secondary | ICD-10-CM

## 2018-08-04 DIAGNOSIS — Z1321 Encounter for screening for nutritional disorder: Secondary | ICD-10-CM

## 2018-08-04 DIAGNOSIS — E7800 Pure hypercholesterolemia, unspecified: Secondary | ICD-10-CM

## 2018-08-04 DIAGNOSIS — Z1329 Encounter for screening for other suspected endocrine disorder: Secondary | ICD-10-CM

## 2018-08-04 DIAGNOSIS — Z1239 Encounter for other screening for malignant neoplasm of breast: Secondary | ICD-10-CM

## 2018-08-04 DIAGNOSIS — Z131 Encounter for screening for diabetes mellitus: Secondary | ICD-10-CM

## 2018-08-04 MED ORDER — BUPROPION HCL ER (XL) 150 MG PO TB24: 150.0000 mg | ORAL_TABLET | Freq: Every day | ORAL | 11 refills | Status: DC

## 2018-08-04 NOTE — Progress Notes (Signed)
HPI:      [name] is a 47 y.o. [phone] who LMP was Patient's last menstrual period was 08/04/2018., she presents today for her annual examination. The patient has no complaints today. STRESS w son Rolla Plate w heart disorder on Transplant list (age 68). The patient is sexually active. Her last pap: approximate date 2018 and was normal and last mammogram: approximate date 2019 and was normal. The patient does perform self breast exams.  There is no notable family history of breast or ovarian cancer in her family.  The patient has regular exercise: yes.  The patient denies current symptoms of depression but feels Contrave has helped w that and is now coming off of Contrave (lost 20+ lbs).   GYN History: Contraception: tubal ligation  PMHx: Past Medical History:  Diagnosis Date  . Anxiety   . Hypertriglyceridemia   . Vitamin D deficiency    Past Surgical History:  Procedure Laterality Date  . 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 CESAREAN SECTION    . TUBAL LIGATION     Family History  Problem Relation Age of Onset  . Diabetes Father   . Hypertension Father   . Kidney cancer Father   . Breast cancer Paternal Grandmother 10   Social History   Tobacco Use  . Smoking status: Never Smoker  . Smokeless tobacco: Never Used  Substance Use Topics  . Alcohol use: No  . Drug use: No    Current Outpatient Medications:  .  levothyroxine (SYNTHROID, LEVOTHROID) 88 MCG tablet, TAKE 1 TABLET BY MOUTH EVERY DAY BEFORE BREAKFAST, Disp: 90 tablet, Rfl: 2 .  Naltrexone-Bupropion HCl ER (CONTRAVE) 8-90 MG TB12, Take 2 tablets by mouth 2 (two) times daily., Disp: 120 tablet, Rfl: 9 Allergies: Patient has no known allergies.  Review of Systems  Constitutional: Negative for chills, fever and malaise/fatigue.  HENT: Negative for congestion, sinus pain and sore throat.   Eyes: Negative for blurred vision and pain.  Respiratory:  Negative for cough and wheezing.   Cardiovascular: Negative for chest pain and leg swelling.  Gastrointestinal: Negative for abdominal pain, constipation, diarrhea, heartburn, nausea and vomiting.  Genitourinary: Negative for dysuria, frequency, hematuria and urgency.  Musculoskeletal: Negative for back pain, joint pain, myalgias and neck pain.  Skin: Negative for itching and rash.  Neurological: Negative for dizziness, tremors and weakness.  Endo/Heme/Allergies: Does not bruise/bleed easily.  Psychiatric/Behavioral: Positive for depression. The patient is not nervous/anxious and does not have insomnia.     Objective: BP 120/60   Ht 5\' 8"  (1.727 m)   Wt 174 lb (78.9 kg)   LMP 08/04/2018   BMI 26.46 kg/m   Filed Weights   08/04/18 0813  Weight: 174 lb (78.9 kg)   Body mass index is 26.46 kg/m. Physical Exam  Constitutional: She is oriented to person, place, and time. She appears well-developed and well-nourished. No distress.  Genitourinary: Rectum normal, vagina normal and uterus normal. Pelvic exam was performed with patient supine. There is no rash or lesion on the right labia. There is no rash or lesion on the left labia. Vagina exhibits no lesion. No bleeding in the vagina. Right adnexum does not display mass and does not display tenderness. Left adnexum does not display mass and does not display tenderness. Cervix does not exhibit motion tenderness, lesion, friability or polyp.   Uterus is mobile and midaxial. Uterus is not enlarged or exhibiting a mass.  HENT:  Head: Normocephalic  and atraumatic. Head is without laceration.  Right Ear: Hearing normal.  Left Ear: Hearing normal.  Nose: No epistaxis.  No foreign bodies.  Mouth/Throat: Uvula is midline, oropharynx is clear and moist and mucous membranes are normal.  Eyes: Pupils are equal, round, and reactive to light.  Neck: Normal range of motion. Neck supple. No thyromegaly present.  Cardiovascular: Normal rate and regular  rhythm. Exam reveals no gallop and no friction rub.  No murmur heard. Pulmonary/Chest: Effort normal and breath sounds normal. No respiratory distress. She has no wheezes. Right breast exhibits no mass, no skin change and no tenderness. Left breast exhibits no mass, no skin change and no tenderness.  Abdominal: Soft. Bowel sounds are normal. She exhibits no distension. There is no tenderness. There is no rebound.  Musculoskeletal: Normal range of motion.  Neurological: She is alert and oriented to person, place, and time. No cranial nerve deficit.  Skin: Skin is warm and dry.  Psychiatric: She has a normal mood and affect. Judgment normal.  Vitals reviewed.   Assessment:  ANNUAL EXAM 1. Annual physical exam   2. Screening for breast cancer   3. Screening for thyroid disorder   4. Screening for diabetes mellitus   5. Encounter for vitamin deficiency screening   6. Hypercholesterolemia    Screening Plan:            1.  Cervical Screening-  Pap smear schedule reviewed with patient  2. Breast screening- Exam annually and mammogram>40 planned   3. Colonoscopy every 10 years, Hemoccult testing - after age 67  4. Labs Ordered today  5. Counseling for contraception: bilateral tubal ligation   6.  Depression, start Wellbutrin (after stops Contrave)   7. Flu shot today  8. Hypercholesterolemia - Lipid panel      F/U  Return in about 1 year (around 08/05/2019) for Annual.  Barnett Applebaum, MD, [facility], [facility] 08/04/2018  8:47 AM

## 2018-08-04 NOTE — Patient Instructions (Signed)
PAP every three years Mammogram every year    Call [phone] to schedule at [facility] Colonoscopy every 10 years Labs yearly (with PCP) 

## 2018-08-05 LAB — LIPID PANEL
Chol/HDL Ratio: 4.7 ratio — ABNORMAL HIGH (ref 0.0–4.4)
Cholesterol, Total: 173 mg/dL (ref 100–199)
HDL: 37 mg/dL — ABNORMAL LOW (ref >39)
LDL CALC: 105 mg/dL — AB (ref 0–99)
Triglycerides: 157 mg/dL — ABNORMAL HIGH (ref 0–149)
VLDL Cholesterol Cal: 31 mg/dL (ref 5–40)

## 2018-08-05 LAB — HEMOGLOBIN A1C
ESTIMATED AVERAGE GLUCOSE: 123 mg/dL
Hgb A1c MFr Bld: 5.9 % — ABNORMAL HIGH (ref 4.8–5.6)

## 2018-08-05 LAB — VITAMIN D 25 HYDROXY (VIT D DEFICIENCY, FRACTURES): VIT D 25 HYDROXY: 24 ng/mL — AB (ref 30.0–100.0)

## 2018-08-05 LAB — TSH: TSH: 1.7 u[IU]/mL (ref 0.450–4.500)

## 2018-08-07 ENCOUNTER — Encounter: Payer: Self-pay | Admitting: Obstetrics & Gynecology

## 2018-08-20 ENCOUNTER — Other Ambulatory Visit: Payer: Self-pay | Admitting: Obstetrics & Gynecology

## 2018-08-23 ENCOUNTER — Other Ambulatory Visit: Payer: Self-pay | Admitting: Obstetrics & Gynecology

## 2018-08-23 MED ORDER — BUPROPION HCL ER (XL) 150 MG PO TB24: 150.0000 mg | ORAL_TABLET | Freq: Every day | ORAL | 4 refills | Status: DC

## 2018-09-16 ENCOUNTER — Other Ambulatory Visit: Payer: Self-pay | Admitting: Obstetrics & Gynecology

## 2018-09-16 DIAGNOSIS — E559 Vitamin D deficiency, unspecified: Secondary | ICD-10-CM | POA: Insufficient documentation

## 2018-11-20 ENCOUNTER — Other Ambulatory Visit: Payer: Self-pay | Admitting: Obstetrics & Gynecology

## 2018-12-19 IMAGING — MG DIGITAL SCREENING BILATERAL MAMMOGRAM WITH CAD
6 series · 6 of 6 positions shown · non-contrast
Comparison: Previous exam(s).

CLINICAL DATA: Screening.

EXAM:
DIGITAL SCREENING BILATERAL MAMMOGRAM WITH CAD

[R CC]
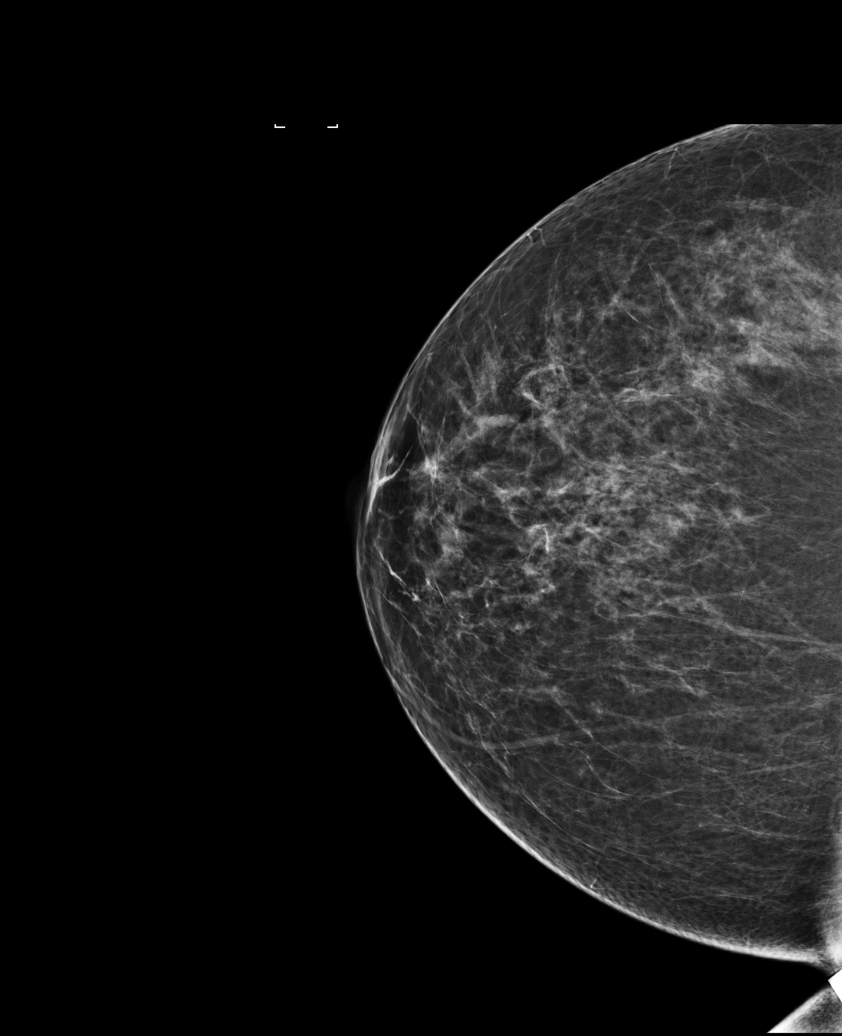

[L CC (1 of 2)]
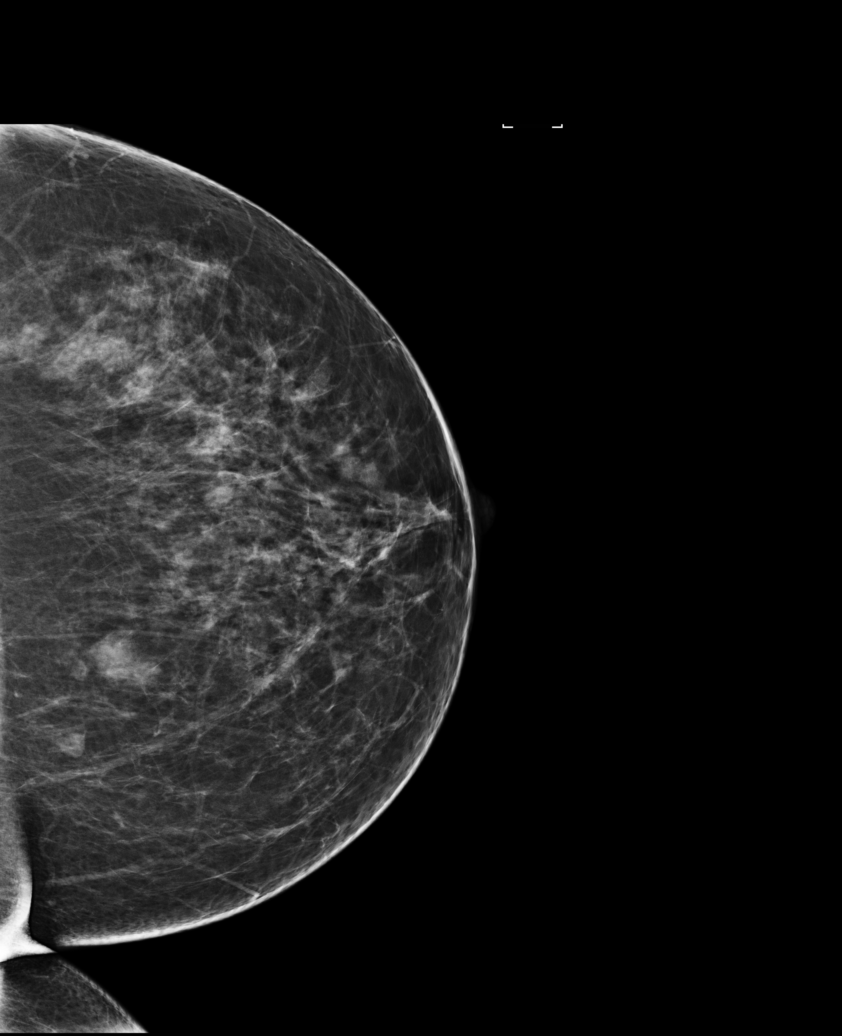

[R MLO]
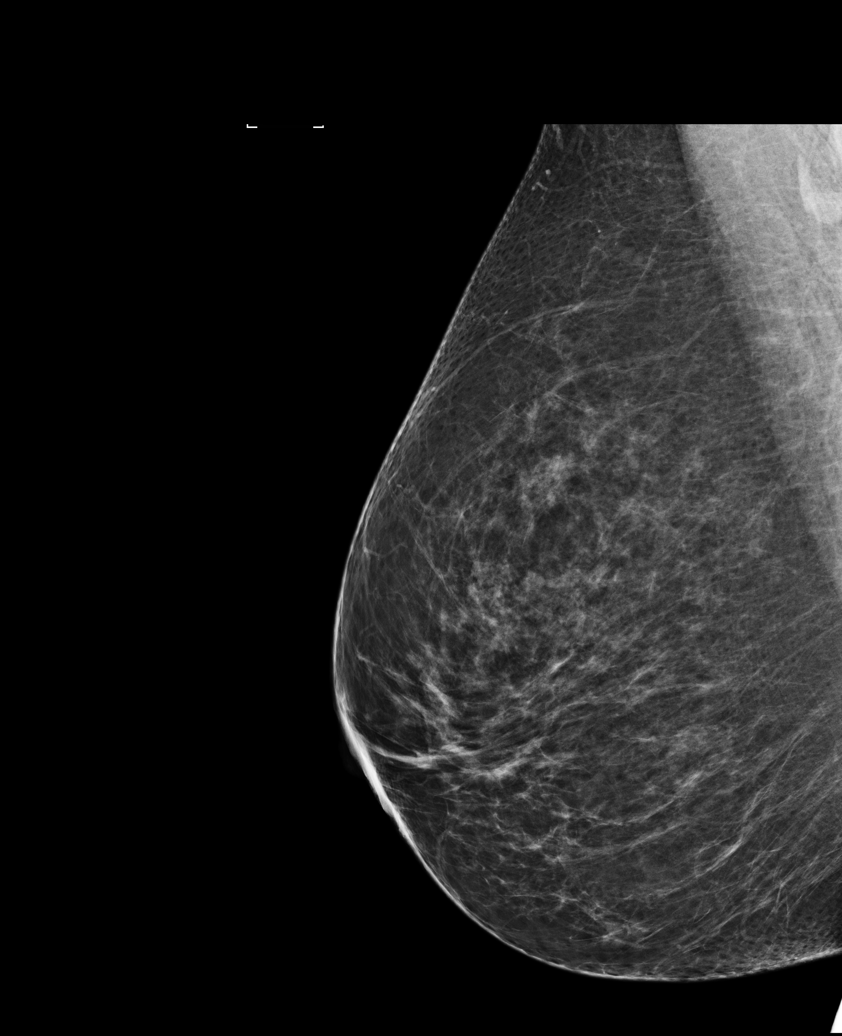

[L MLO (1 of 2)]
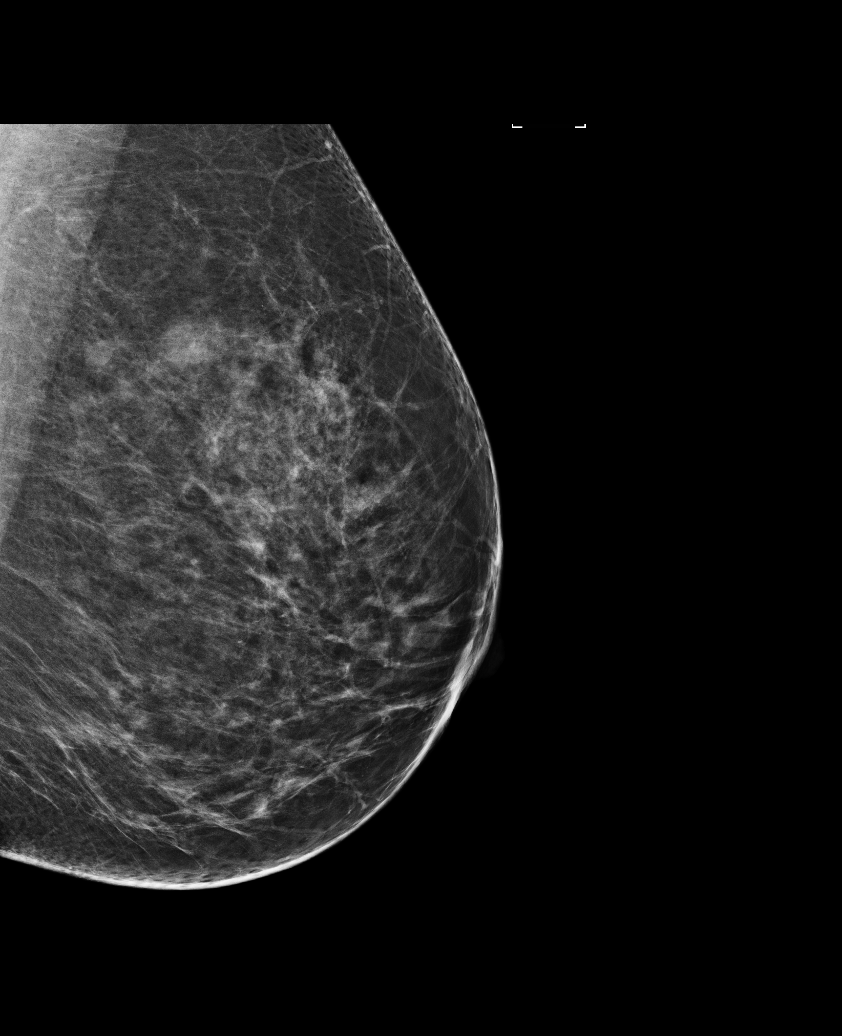

[L CC (2 of 2)]
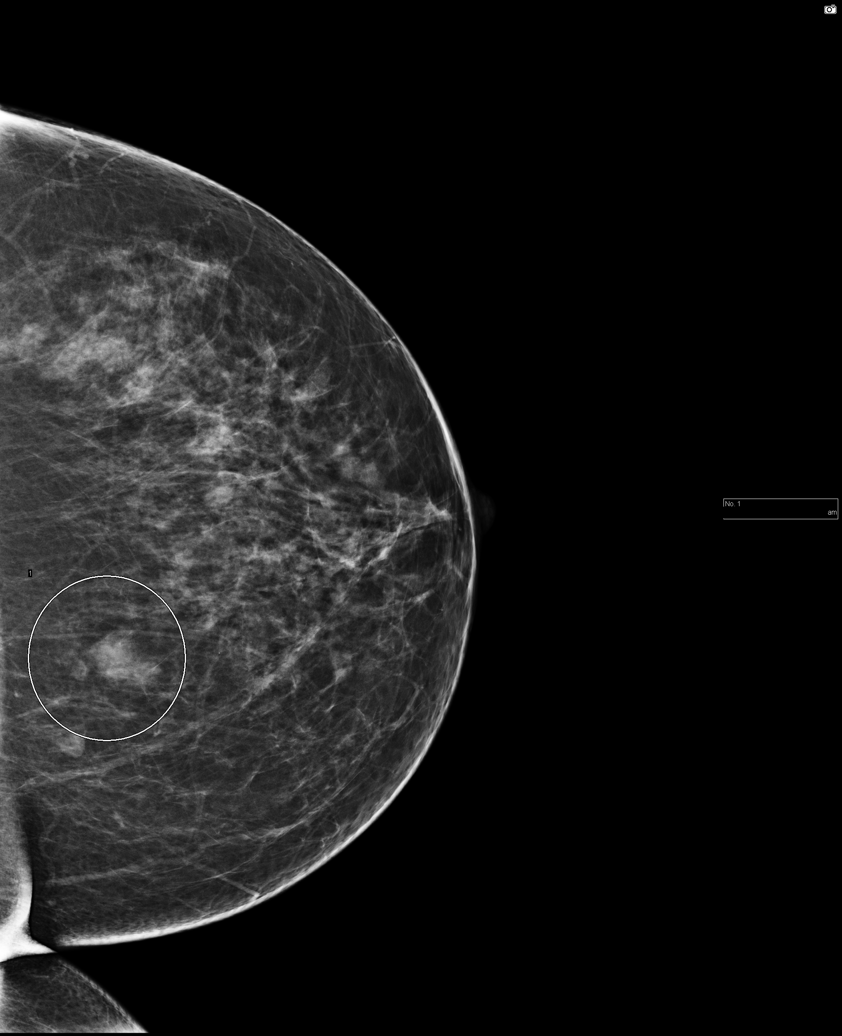

[L MLO (2 of 2)]
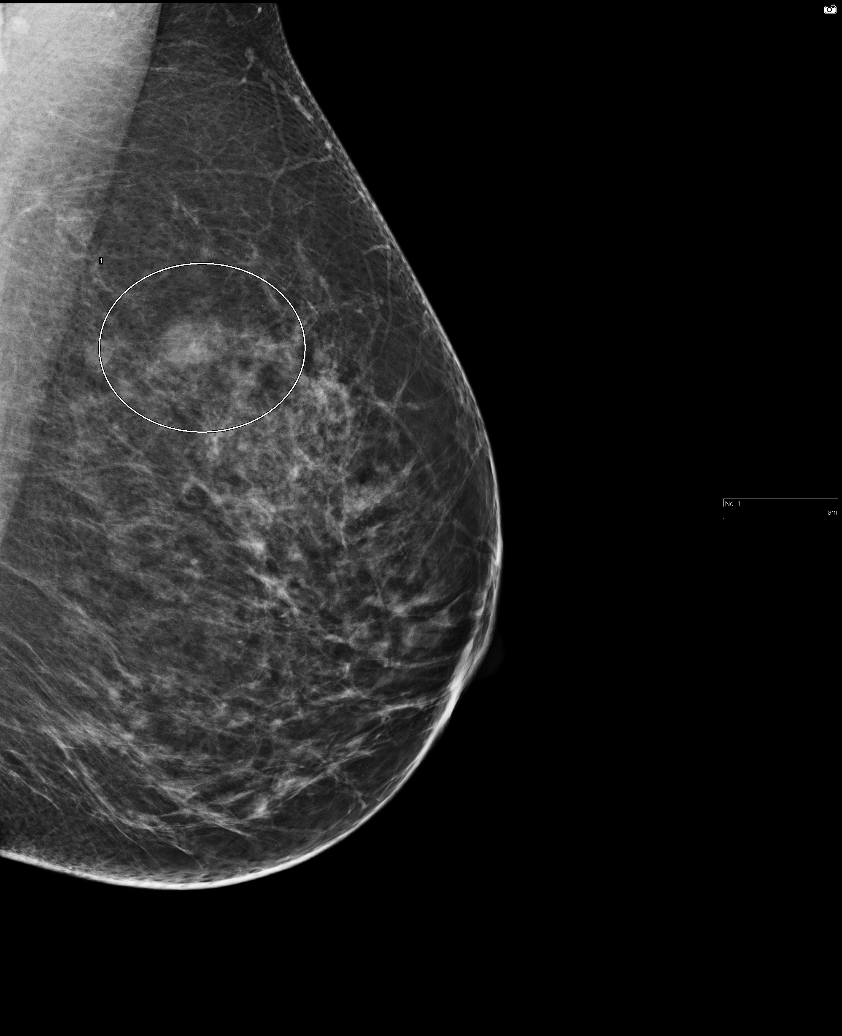

[6 of 6 positions shown; findings below may reference images not displayed]

ACR Breast Density Category b: There are scattered areas of
fibroglandular density.
FINDINGS: In the left breast, a possible mass warrants further evaluation. In
the right breast, no findings suspicious for malignancy.

Images were processed with CAD.
IMPRESSION: Further evaluation is suggested for possible mass in the left
breast.

RECOMMENDATION:
Diagnostic mammogram and possibly ultrasound of the left breast.
(Code:5M-3-PPT)

The patient will be contacted regarding the findings, and additional
imaging will be scheduled.

BI-RADS CATEGORY  0: Incomplete. Need additional imaging evaluation
and/or prior mammograms for comparison.

## 2019-06-30 ENCOUNTER — Telehealth: Payer: Self-pay

## 2019-06-30 ENCOUNTER — Other Ambulatory Visit: Payer: Self-pay | Admitting: Obstetrics & Gynecology

## 2019-06-30 DIAGNOSIS — Z1231 Encounter for screening mammogram for malignant neoplasm of breast: Secondary | ICD-10-CM

## 2019-06-30 NOTE — Telephone Encounter (Signed)
Patient called Hayley Mills to schedule her mammogram today. She was asked about any breast problems and she mentioned they were sore (like sore before your period). Hayley Mills advised her to contact us for a diagnostic order since she is having "problems". Her apt w/[facility] isn't until 08/09/2019. She doesn't want to wait that long to have her mammogram. [phone]

## 2019-06-30 NOTE — Telephone Encounter (Signed)
MMG order placed for screening MMG, no need for Dx MMG

## 2019-07-01 NOTE — Telephone Encounter (Signed)
LMVM to notify patient.

## 2019-07-22 ENCOUNTER — Other Ambulatory Visit: Payer: Self-pay

## 2019-07-22 DIAGNOSIS — Z20822 Contact with and (suspected) exposure to covid-19: Secondary | ICD-10-CM

## 2019-07-24 LAB — NOVEL CORONAVIRUS, NAA: SARS-CoV-2, NAA: NOT DETECTED

## 2019-07-27 ENCOUNTER — Ambulatory Visit
Admission: RE | Admit: 2019-07-27 | Discharge: 2019-07-27 | Disposition: A | Discharge status: Home / Self Care | Payer: BC Managed Care – PPO | Source: Ambulatory Visit | Attending: Obstetrics & Gynecology | Admitting: Obstetrics & Gynecology

## 2019-07-27 DIAGNOSIS — Z1231 Encounter for screening mammogram for malignant neoplasm of breast: Secondary | ICD-10-CM

## 2019-07-28 ENCOUNTER — Encounter: Payer: Self-pay | Admitting: Obstetrics & Gynecology

## 2019-08-09 ENCOUNTER — Ambulatory Visit (INDEPENDENT_AMBULATORY_CARE_PROVIDER_SITE_OTHER): Payer: BC Managed Care – PPO | Admitting: Obstetrics & Gynecology

## 2019-08-09 ENCOUNTER — Other Ambulatory Visit: Payer: Self-pay

## 2019-08-09 ENCOUNTER — Encounter: Payer: Self-pay | Admitting: Obstetrics & Gynecology

## 2019-08-09 VITALS — BP 120/80 | Temp 96.8°F | Ht 68.0 in | Wt 192.0 lb

## 2019-08-09 DIAGNOSIS — E7800 Pure hypercholesterolemia, unspecified: Secondary | ICD-10-CM

## 2019-08-09 DIAGNOSIS — Z01419 Encounter for gynecological examination (general) (routine) without abnormal findings: Secondary | ICD-10-CM

## 2019-08-09 DIAGNOSIS — Z131 Encounter for screening for diabetes mellitus: Secondary | ICD-10-CM

## 2019-08-09 DIAGNOSIS — Z23 Encounter for immunization: Secondary | ICD-10-CM

## 2019-08-09 DIAGNOSIS — Z1321 Encounter for screening for nutritional disorder: Secondary | ICD-10-CM

## 2019-08-09 DIAGNOSIS — E039 Hypothyroidism, unspecified: Secondary | ICD-10-CM

## 2019-08-09 NOTE — Patient Instructions (Signed)
PAP every three years    Due 2021 Mammogram every year (due 07/2020    Call [phone] to schedule at [facility] Colonoscopy every 10 years after age 47 Labs yearly (today)

## 2019-08-09 NOTE — Progress Notes (Signed)
HPI:      [name] is a 48 y.o. [phone] who LMP was Patient's last menstrual period was 07/10/2019 (exact date)., she presents today for her annual examination. The patient has no complaints today. The patient is sexually active. Her last pap: approximate date 2018 and was normal and last mammogram: approximate date 07/2019 and was normal. The patient does perform self breast exams.  There is no notable family history of breast or ovarian cancer in her family.  The patient has regular exercise: yes.  The patient denies current symptoms of depression.   Some perimenopausal sx's of irreg periods (still monthly) and mood and breast changes  GYN History: Contraception: tubal ligation  PMHx: Past Medical History:  Diagnosis Date  . Anxiety   . Hypertriglyceridemia   . Vitamin D deficiency    Past Surgical History:  Procedure Laterality Date  . BREAST BIOPSY Left 04/28/2017   pseudoangiomatous stromal hyperplasia ([place])   . CESAREAN SECTION    . TUBAL LIGATION     Family History  Problem Relation Age of Onset  . Diabetes Father   . Hypertension Father   . Kidney cancer Father   . Breast cancer Paternal Grandmother 68   Social History   Tobacco Use  . Smoking status: Never Smoker  . Smokeless tobacco: Never Used  Substance Use Topics  . Alcohol use: No  . Drug use: No    Current Outpatient Medications:  .  levothyroxine (SYNTHROID, LEVOTHROID) 88 MCG tablet, TAKE 1 TABLET BY MOUTH EVERY DAY BEFORE BREAKFAST, Disp: 90 tablet, Rfl: 2 .  buPROPion (WELLBUTRIN XL) 150 MG 24 hr tablet, Take 1 tablet (150 mg total) by mouth daily. (Patient not taking: Reported on 08/09/2019), Disp: 90 tablet, Rfl: 4 .  Naltrexone-Bupropion HCl ER (CONTRAVE) 8-90 MG TB12, Take 2 tablets by mouth 2 (two) times daily. (Patient not taking: Reported on 08/09/2019), Disp: 120 tablet, Rfl: 9 Allergies: Patient has no known allergies.  Review of Systems  Constitutional: Negative for  chills, fever and malaise/fatigue.  HENT: Negative for congestion, sinus pain and sore throat.   Eyes: Negative for blurred vision and pain.  Respiratory: Negative for cough and wheezing.   Cardiovascular: Negative for chest pain and leg swelling.  Gastrointestinal: Negative for abdominal pain, constipation, diarrhea, heartburn, nausea and vomiting.  Genitourinary: Negative for dysuria, frequency, hematuria and urgency.  Musculoskeletal: Negative for back pain, joint pain, myalgias and neck pain.  Skin: Negative for itching and rash.  Neurological: Negative for dizziness, tremors and weakness.  Endo/Heme/Allergies: Does not bruise/bleed easily.  Psychiatric/Behavioral: Negative for depression. The patient is not nervous/anxious and does not have insomnia.     Objective: BP 120/80   Temp (!) 96.8 F (36 C)   Ht 5\' 8"  (1.727 m)   Wt 192 lb (87.1 kg)   LMP 07/10/2019 (Exact Date)   BMI 29.19 kg/m   Filed Weights   08/09/19 1041  Weight: 192 lb (87.1 kg)   Body mass index is 29.19 kg/m. Physical Exam Constitutional:      General: She is not in acute distress.    Appearance: She is well-developed.  Genitourinary:     Pelvic exam was performed with patient supine.     Urethra, bladder, vagina, uterus and rectum normal.     No lesions in the vagina.     No vaginal bleeding.     No cervical motion tenderness, friability, lesion or polyp.     Uterus is mobile.  Uterus is not enlarged.     No uterine mass detected.    Uterus is anteverted.     No right or left adnexal mass present.     Right adnexa not tender.     Left adnexa not tender.  HENT:     Head: Normocephalic and atraumatic. No laceration.     Right Ear: Hearing normal.     Left Ear: Hearing normal.     Mouth/Throat:     Pharynx: Uvula midline.  Eyes:     Pupils: Pupils are equal, round, and reactive to light.  Neck:     Musculoskeletal: Normal range of motion and neck supple.     Thyroid: No thyromegaly.   Cardiovascular:     Rate and Rhythm: Normal rate and regular rhythm.     Heart sounds: No murmur. No friction rub. No gallop.   Pulmonary:     Effort: Pulmonary effort is normal. No respiratory distress.     Breath sounds: Normal breath sounds. No wheezing.  Chest:     Breasts:        Right: No mass, skin change or tenderness.        Left: No mass, skin change or tenderness.  Abdominal:     General: Bowel sounds are normal. There is no distension.     Palpations: Abdomen is soft.     Tenderness: There is no abdominal tenderness. There is no rebound.  Musculoskeletal: Normal range of motion.  Neurological:     Mental Status: She is alert and oriented to person, place, and time.     Cranial Nerves: No cranial nerve deficit.  Skin:    General: Skin is warm and dry.  Psychiatric:        Judgment: Judgment normal.  Vitals signs reviewed.     Assessment:  ANNUAL EXAM 1. Women's annual routine gynecological examination   2. Need for immunization against influenza   3. Hypercholesterolemia   4. Hypothyroidism, unspecified type   5. Screening for diabetes mellitus   6. Encounter for vitamin deficiency screening      Screening Plan:            1.  Cervical Screening-  Pap smear schedule reviewed with patient  2. Breast screening- Exam annually and mammogram>40 planned   3. Colonoscopy every 10 years, Hemoccult testing - after age 75  4. Labs Ordered today  5. Counseling for contraception: bilateral tubal ligation   6. Need for immunization against influenza - Flu Vaccine QUAD 36+ mos IM   7. Hypercholesterolemia - Weight management discussed - Fasting Lipid panel   8. Hypothyroidism, unspecified type - TSH   9. Encounter for vitamin deficiency screening - VITAMIN D 25 Hydroxy (Vit-D Deficiency, Fractures)      F/U  Return in about 1 year (around 08/08/2020) for Annual.  Barnett Applebaum, MD, [facility], [facility] 08/09/2019  11:10 AM

## 2019-08-10 ENCOUNTER — Encounter: Payer: Self-pay | Admitting: Obstetrics & Gynecology

## 2019-08-10 LAB — LIPID PANEL
Chol/HDL Ratio: 5.9 ratio — ABNORMAL HIGH (ref 0.0–4.4)
Cholesterol, Total: 212 mg/dL — ABNORMAL HIGH (ref 100–199)
HDL: 36 mg/dL — ABNORMAL LOW (ref >39)
LDL Chol Calc (NIH): 128 mg/dL — ABNORMAL HIGH (ref 0–99)
Triglycerides: 267 mg/dL — ABNORMAL HIGH (ref 0–149)
VLDL Cholesterol Cal: 48 mg/dL — ABNORMAL HIGH (ref 5–40)

## 2019-08-10 LAB — HEMOGLOBIN A1C
Est. average glucose Bld gHb Est-mCnc: 123 mg/dL
Hgb A1c MFr Bld: 5.9 % — ABNORMAL HIGH (ref 4.8–5.6)

## 2019-08-10 LAB — VITAMIN D 25 HYDROXY (VIT D DEFICIENCY, FRACTURES): Vit D, 25-Hydroxy: 33.1 ng/mL (ref 30.0–100.0)

## 2019-08-10 LAB — TSH: TSH: 1.91 u[IU]/mL (ref 0.450–4.500)

## 2019-08-24 ENCOUNTER — Other Ambulatory Visit: Payer: Self-pay | Admitting: Obstetrics & Gynecology

## 2020-03-25 ENCOUNTER — Other Ambulatory Visit: Payer: Self-pay

## 2020-03-25 ENCOUNTER — Inpatient Hospital Stay
Admission: EM | Admit: 2020-03-25 | Discharge: 2020-03-27 | DRG: 918 | Disposition: A | Discharge status: Home / Self Care | Payer: BC Managed Care – PPO | Attending: Internal Medicine | Admitting: Internal Medicine

## 2020-03-25 DIAGNOSIS — R202 Paresthesia of skin: Secondary | ICD-10-CM | POA: Diagnosis present

## 2020-03-25 DIAGNOSIS — R2681 Unsteadiness on feet: Secondary | ICD-10-CM | POA: Diagnosis present

## 2020-03-25 DIAGNOSIS — R252 Cramp and spasm: Secondary | ICD-10-CM | POA: Diagnosis present

## 2020-03-25 DIAGNOSIS — T63311A Toxic effect of venom of black widow spider, accidental (unintentional), initial encounter: Principal | ICD-10-CM | POA: Diagnosis present

## 2020-03-25 DIAGNOSIS — Z79899 Other long term (current) drug therapy: Secondary | ICD-10-CM

## 2020-03-25 DIAGNOSIS — Z8249 Family history of ischemic heart disease and other diseases of the circulatory system: Secondary | ICD-10-CM

## 2020-03-25 DIAGNOSIS — Z833 Family history of diabetes mellitus: Secondary | ICD-10-CM

## 2020-03-25 DIAGNOSIS — Z803 Family history of malignant neoplasm of breast: Secondary | ICD-10-CM

## 2020-03-25 DIAGNOSIS — Z20822 Contact with and (suspected) exposure to covid-19: Secondary | ICD-10-CM | POA: Diagnosis present

## 2020-03-25 DIAGNOSIS — R200 Anesthesia of skin: Secondary | ICD-10-CM | POA: Diagnosis present

## 2020-03-25 DIAGNOSIS — T63301A Toxic effect of unspecified spider venom, accidental (unintentional), initial encounter: Secondary | ICD-10-CM | POA: Diagnosis present

## 2020-03-25 DIAGNOSIS — Z79890 Hormone replacement therapy (postmenopausal): Secondary | ICD-10-CM

## 2020-03-25 DIAGNOSIS — J449 Chronic obstructive pulmonary disease, unspecified: Secondary | ICD-10-CM | POA: Diagnosis present

## 2020-03-25 DIAGNOSIS — E785 Hyperlipidemia, unspecified: Secondary | ICD-10-CM | POA: Diagnosis present

## 2020-03-25 DIAGNOSIS — Z9189 Other specified personal risk factors, not elsewhere classified: Secondary | ICD-10-CM

## 2020-03-25 DIAGNOSIS — E039 Hypothyroidism, unspecified: Secondary | ICD-10-CM | POA: Diagnosis not present

## 2020-03-25 DIAGNOSIS — M62838 Other muscle spasm: Secondary | ICD-10-CM | POA: Diagnosis present

## 2020-03-25 DIAGNOSIS — I2510 Atherosclerotic heart disease of native coronary artery without angina pectoris: Secondary | ICD-10-CM | POA: Diagnosis present

## 2020-03-25 DIAGNOSIS — Z8051 Family history of malignant neoplasm of kidney: Secondary | ICD-10-CM

## 2020-03-25 DIAGNOSIS — E663 Overweight: Secondary | ICD-10-CM | POA: Diagnosis present

## 2020-03-25 DIAGNOSIS — E781 Pure hyperglyceridemia: Secondary | ICD-10-CM | POA: Diagnosis present

## 2020-03-25 DIAGNOSIS — R531 Weakness: Secondary | ICD-10-CM | POA: Diagnosis present

## 2020-03-25 DIAGNOSIS — E559 Vitamin D deficiency, unspecified: Secondary | ICD-10-CM | POA: Diagnosis present

## 2020-03-25 DIAGNOSIS — F419 Anxiety disorder, unspecified: Secondary | ICD-10-CM | POA: Diagnosis present

## 2020-03-25 LAB — CBC WITH DIFFERENTIAL/PLATELET
Abs Immature Granulocytes: 0.03 10*3/uL (ref 0.00–0.07)
Basophils Absolute: 0.1 10*3/uL (ref 0.0–0.1)
Basophils Relative: 1 %
Eosinophils Absolute: 0.2 10*3/uL (ref 0.0–0.5)
Eosinophils Relative: 2 %
HCT: 42.1 % (ref 36.0–46.0)
Hemoglobin: 14.1 g/dL (ref 12.0–15.0)
Immature Granulocytes: 0 %
Lymphocytes Relative: 32 %
Lymphs Abs: 2.6 10*3/uL (ref 0.7–4.0)
MCH: 28.7 pg (ref 26.0–34.0)
MCHC: 33.5 g/dL (ref 30.0–36.0)
MCV: 85.7 fL (ref 80.0–100.0)
Monocytes Absolute: 0.9 10*3/uL (ref 0.1–1.0)
Monocytes Relative: 11 %
Neutro Abs: 4.5 10*3/uL (ref 1.7–7.7)
Neutrophils Relative %: 54 %
Platelets: 360 10*3/uL (ref 150–400)
RBC: 4.91 MIL/uL (ref 3.87–5.11)
RDW: 13.2 % (ref 11.5–15.5)
WBC: 8.2 10*3/uL (ref 4.0–10.5)
nRBC: 0 % (ref 0.0–0.2)

## 2020-03-25 LAB — COMPREHENSIVE METABOLIC PANEL
ALT: 110 U/L — ABNORMAL HIGH (ref 0–44)
AST: 80 U/L — ABNORMAL HIGH (ref 15–41)
Albumin: 4.4 g/dL (ref 3.5–5.0)
Alkaline Phosphatase: 70 U/L (ref 38–126)
Anion gap: 12 (ref 5–15)
BUN: 10 mg/dL (ref 6–20)
CO2: 24 mmol/L (ref 22–32)
Calcium: 9.3 mg/dL (ref 8.9–10.3)
Chloride: 101 mmol/L (ref 98–111)
Creatinine, Ser: 0.88 mg/dL (ref 0.44–1.00)
GFR calc Af Amer: >60 mL/min (ref >60)
GFR calc non Af Amer: >60 mL/min (ref >60)
Glucose, Bld: 133 mg/dL — ABNORMAL HIGH (ref 70–99)
Potassium: 3.6 mmol/L (ref 3.5–5.1)
Sodium: 137 mmol/L (ref 135–145)
Total Bilirubin: 0.6 mg/dL (ref 0.3–1.2)
Total Protein: 7.9 g/dL (ref 6.5–8.1)

## 2020-03-25 LAB — APTT: aPTT: 29 seconds (ref 24–36)

## 2020-03-25 LAB — SARS CORONAVIRUS 2 BY RT PCR (HOSPITAL ORDER, PERFORMED IN ~~LOC~~ HOSPITAL LAB): SARS Coronavirus 2: NEGATIVE

## 2020-03-25 LAB — PROTIME-INR
INR: 0.9 (ref 0.8–1.2)
Prothrombin Time: 11.9 seconds (ref 11.4–15.2)

## 2020-03-25 MED ORDER — SODIUM CHLORIDE 0.9 % IV SOLN: 250.0000 mL | INTRAVENOUS | Status: DC | PRN

## 2020-03-25 MED ORDER — SODIUM CHLORIDE 0.9% FLUSH: 3.0000 mL | INTRAVENOUS | Status: DC | PRN

## 2020-03-25 MED ORDER — LORAZEPAM 2 MG/ML IJ SOLN
0.5000 mg | Freq: Once | INTRAMUSCULAR | Status: AC
  Administered 2020-03-25: 0.5 mg via INTRAVENOUS
  Filled 2020-03-25: qty 1

## 2020-03-25 MED ORDER — ONDANSETRON HCL 4 MG PO TABS: 4.0000 mg | ORAL_TABLET | Freq: Four times a day (QID) | ORAL | Status: DC | PRN

## 2020-03-25 MED ORDER — LEVOTHYROXINE SODIUM 88 MCG PO TABS
88.0000 ug | ORAL_TABLET | Freq: Every day | ORAL | Status: DC
  Administered 2020-03-26 – 2020-03-27 (×2): 88 ug via ORAL
  Filled 2020-03-25 (×2): qty 1

## 2020-03-25 MED ORDER — DIAZEPAM 5 MG PO TABS
5.0000 mg | ORAL_TABLET | Freq: Four times a day (QID) | ORAL | Status: DC
  Administered 2020-03-26 – 2020-03-27 (×6): 5 mg via ORAL
  Filled 2020-03-25 (×6): qty 1

## 2020-03-25 MED ORDER — DIAZEPAM 2 MG PO TABS
2.0000 mg | ORAL_TABLET | Freq: Once | ORAL | Status: AC
  Administered 2020-03-25: 2 mg via ORAL
  Filled 2020-03-25: qty 1

## 2020-03-25 MED ORDER — METHOCARBAMOL 500 MG PO TABS
500.0000 mg | ORAL_TABLET | Freq: Once | ORAL | Status: AC
  Administered 2020-03-26: 500 mg via ORAL
  Filled 2020-03-25: qty 1

## 2020-03-25 MED ORDER — BUPROPION HCL ER (XL) 150 MG PO TB24
150.0000 mg | ORAL_TABLET | Freq: Every day | ORAL | Status: DC
  Administered 2020-03-25 – 2020-03-26 (×2): 150 mg via ORAL
  Filled 2020-03-25 (×2): qty 1

## 2020-03-25 MED ORDER — SODIUM CHLORIDE 0.9% FLUSH
3.0000 mL | Freq: Two times a day (BID) | INTRAVENOUS | Status: DC
  Administered 2020-03-26 – 2020-03-27 (×3): 3 mL via INTRAVENOUS

## 2020-03-25 MED ORDER — MORPHINE SULFATE (PF) 4 MG/ML IV SOLN
4.0000 mg | Freq: Once | INTRAVENOUS | Status: AC
  Administered 2020-03-25: 4 mg via INTRAVENOUS
  Filled 2020-03-25: qty 1

## 2020-03-25 MED ORDER — KETOROLAC TROMETHAMINE 30 MG/ML IJ SOLN
30.0000 mg | Freq: Four times a day (QID) | INTRAMUSCULAR | Status: DC | PRN
  Administered 2020-03-25: 30 mg via INTRAVENOUS

## 2020-03-25 MED ORDER — KETOROLAC TROMETHAMINE 30 MG/ML IJ SOLN
30.0000 mg | Freq: Once | INTRAMUSCULAR | Status: AC
  Administered 2020-03-25: 30 mg via INTRAVENOUS

## 2020-03-25 MED ORDER — KETOROLAC TROMETHAMINE 30 MG/ML IJ SOLN
30.0000 mg | Freq: Once | INTRAMUSCULAR | Status: DC
  Filled 2020-03-25: qty 1

## 2020-03-25 MED ORDER — MORPHINE SULFATE (PF) 2 MG/ML IV SOLN
2.0000 mg | INTRAVENOUS | Status: DC | PRN
  Administered 2020-03-25 – 2020-03-26 (×2): 2 mg via INTRAVENOUS
  Filled 2020-03-25 (×2): qty 1

## 2020-03-25 MED ORDER — ONDANSETRON HCL 4 MG/2ML IJ SOLN: 4.0000 mg | Freq: Four times a day (QID) | INTRAMUSCULAR | Status: DC | PRN

## 2020-03-25 MED ORDER — ENOXAPARIN SODIUM 40 MG/0.4ML ~~LOC~~ SOLN
40.0000 mg | SUBCUTANEOUS | Status: DC
  Administered 2020-03-25 – 2020-03-26 (×2): 40 mg via SUBCUTANEOUS
  Filled 2020-03-25 (×2): qty 0.4

## 2020-03-25 MED ORDER — ONDANSETRON HCL 4 MG/2ML IJ SOLN
4.0000 mg | Freq: Once | INTRAMUSCULAR | Status: AC
  Administered 2020-03-25: 4 mg via INTRAVENOUS
  Filled 2020-03-25: qty 2

## 2020-03-25 MED ORDER — SODIUM CHLORIDE 0.9 % IV BOLUS
1000.0000 mL | Freq: Once | INTRAVENOUS | Status: AC
  Administered 2020-03-25: 1000 mL via INTRAVENOUS

## 2020-03-25 MED ORDER — FENTANYL CITRATE (PF) 100 MCG/2ML IJ SOLN
50.0000 ug | Freq: Once | INTRAMUSCULAR | Status: AC
  Administered 2020-03-25: 50 ug via INTRAVENOUS
  Filled 2020-03-25: qty 2

## 2020-03-25 MED ORDER — KETOROLAC TROMETHAMINE 30 MG/ML IJ SOLN
30.0000 mg | Freq: Four times a day (QID) | INTRAMUSCULAR | Status: DC
  Administered 2020-03-26 – 2020-03-27 (×5): 30 mg via INTRAVENOUS
  Filled 2020-03-25 (×7): qty 1

## 2020-03-25 NOTE — ED Notes (Signed)
Pt may have regular diet per admitting MD. Pt given sandwich tray and milk.

## 2020-03-25 NOTE — ED Notes (Signed)
Spoke with Magda Paganini with poison control, see recommendations Supportive care with benzos and morphine Abdomen and muscles may become rigid with headache, restlessness Wound care tdap if not utd Do not recommend antivenom

## 2020-03-25 NOTE — ED Notes (Signed)
Pt bitten by spider this afternoon. Outer left breast reddened around bite area.

## 2020-03-25 NOTE — ED Provider Notes (Signed)
[facility] Emergency Department Provider Note  ____________________________________________   First MD Initiated Contact with Patient 03/25/20 1636     (approximate)  I have reviewed the triage vital signs and the nursing notes.   HISTORY  Chief Complaint spider bite    HPI Hayley Mills is a 49 y.o. female presents emergency department after a black widow bite 1 hour pta. States on the left outer breast. Patient has started to have muscle cramping, nausea, and numbness and tingling across the face. No fever or chills.    Past Medical History:  Diagnosis Date  . Anxiety   . Hypertriglyceridemia   . Vitamin D deficiency     Patient Active Problem List   Diagnosis Date Noted  . Vitamin D deficiency 09/16/2018  . Overweight (BMI 25.0-29.9) 08/12/2017  . Hypothyroidism 08/12/2017  . Left lower quadrant pain 04/29/2017  . Weight gain 04/29/2017  . BMI 31.0-31.9,adult 04/29/2017    Past Surgical History:  Procedure Laterality Date  . BREAST BIOPSY Left 04/28/2017   pseudoangiomatous stromal hyperplasia ([place])   . CESAREAN SECTION    . TUBAL LIGATION      Prior to Admission medications   Medication Sig Start Date End Date Taking? Authorizing Provider  buPROPion (WELLBUTRIN XL) 150 MG 24 hr tablet Take 1 tablet (150 mg total) by mouth daily. Patient not taking: Reported on 08/09/2019 08/23/18   Gae Dry, MD  levothyroxine (SYNTHROID) 88 MCG tablet TAKE 1 TABLET BY MOUTH EVERY DAY BEFORE BREAKFAST 08/24/19   Gae Dry, MD  Naltrexone-Bupropion HCl ER (CONTRAVE) 8-90 MG TB12 Take 2 tablets by mouth 2 (two) times daily. Patient not taking: Reported on 08/09/2019 08/12/17   Gae Dry, MD    Allergies Patient has no known allergies.  Family History  Problem Relation Age of Onset  . Diabetes Father   . Hypertension Father   . Kidney cancer Father   . Breast cancer Paternal Grandmother 63    Social  History Social History   Tobacco Use  . Smoking status: Never Smoker  . Smokeless tobacco: Never Used  Vaping Use  . Vaping Use: Never used  Substance Use Topics  . Alcohol use: No  . Drug use: No    Review of Systems  Constitutional: No fever/chills Eyes: No visual changes. ENT: No sore throat. Respiratory: Denies cough Cardiovascular: Denies chest pain Gastrointestinal: Denies abdominal pain Genitourinary: Negative for dysuria. Musculoskeletal: Negative for back pain. Positive muscle spasms Skin: Negative for rash. Positive spider bite Psychiatric: no mood changes,     ____________________________________________   PHYSICAL EXAM:  VITAL SIGNS: ED Triage Vitals  Enc Vitals Group     BP 03/25/20 1622 (!) 148/88     Pulse Rate 03/25/20 1622 87     Resp 03/25/20 1622 18     Temp 03/25/20 1622 98.1 F (36.7 C)     Temp src --      SpO2 03/25/20 1622 100 %     Weight 03/25/20 1621 195 lb (88.5 kg)     Height 03/25/20 1621 5\' 8"  (1.727 m)     Head Circumference --      Peak Flow --      Pain Score 03/25/20 1621 5     Pain Loc --      Pain Edu? --      Excl. in [place]? --     Constitutional: Alert and oriented. Well appearing and in no acute distress. Eyes: Conjunctivae are normal.  Head: Atraumatic. Nose: No congestion/rhinnorhea. Mouth/Throat: Mucous membranes are moist.   Neck:  supple no lymphadenopathy noted Cardiovascular: Normal rate, regular rhythm. Heart sounds are normal Respiratory: Normal respiratory effort.  No retractions, lungs c t a  Breast: Left breast has a large red streak noted across the left medial outer quadrant heading towards the nipple Abd: soft nontender bs normal all 4 quad GU: deferred Musculoskeletal: FROM all extremities, warm and well perfused Neurologic:  Normal speech and language.  Skin:  Skin is warm, dry and intact. See breast exam Psychiatric: Mood and affect are normal. Speech and behavior are  normal.  ____________________________________________   LABS (all labs ordered are listed, but only abnormal results are displayed)  Labs Reviewed  COMPREHENSIVE METABOLIC PANEL - Abnormal; Notable for the following components:      Result Value   Glucose, Bld 133 (*)    AST 80 (*)    ALT 110 (*)    All other components within normal limits  SARS CORONAVIRUS 2 BY RT PCR (HOSPITAL ORDER, [facility])  CBC WITH DIFFERENTIAL/PLATELET  PROTIME-INR  APTT   ____________________________________________   ____________________________________________  RADIOLOGY    ____________________________________________   PROCEDURES  Procedure(s) performed: No  Procedures    ____________________________________________   INITIAL IMPRESSION / ASSESSMENT AND PLAN / ED COURSE  Pertinent labs & imaging results that were available during my care of the patient were reviewed by me and considered in my medical decision making (see chart for details).   Patient's 49 year old female presents emergency department after a black widow bite. See HPI  Physical exam shows patient's vitals to appear stable. Patient has experienced muscle spasms in neurologic symptoms.  DDx: Spider bite, black widow bite, tetanus   Tdap is up-to-date Labs for CBC, PT, PTT, metabolic panel ordered  Patient was given morphine 4 mg IV, Zofran 4 mg IV, Ativan 0.5 mg IV and normal saline 1 L IV  ----------------------------------------- 6:22 PM on 03/25/2020 -----------------------------------------  Patient was also given fentanyl 50 mcg IV, Ativan 0.5 mg IV as her pain is still not under control.  Due to the need for pain control due to the black widow spider bite feel that patient will be admitted.  Labs are reassuring, liver enzymes slightly elevated AST and ALT  Consulted hospitalist  Hospitalist will be admitting for pain control.  He recommended I give her Toradol 30 mg IV  and Valium 2 mg p.o.  As part of my medical decision making, I reviewed the following data within the [place] History obtained from family, Nursing notes reviewed and incorporated, Labs reviewed , Old chart reviewed, Discussed with admitting physician , Notes from prior ED visits and [place] Controlled Substance Database  ____________________________________________   FINAL CLINICAL IMPRESSION(S) / ED DIAGNOSES  Final diagnoses:  Black widow spider bite, accidental or unintentional, initial encounter  At risk for inadequate pain control      NEW MEDICATIONS STARTED DURING THIS VISIT:  New Prescriptions   No medications on file     Note:  This document was prepared using Dragon voice recognition software and may include unintentional dictation errors.    Versie Starks, PA-C 03/25/20 Allegra [place]    Duffy Bruce, MD 03/25/20 1958

## 2020-03-25 NOTE — H&P (Signed)
History and Physical    Hayley Mills ZES:[id number] DOB: 1971/07/10 DOA: 03/25/2020  PCP: Gae Dry, MD (Confirm with patient/family/[facility] records and if not entered, this has to be entered at [facility] point of entry) Patient coming from: Home  I have personally briefly reviewed patient's old medical records in [place]  Chief Complaint: black widow spider bite with pain and muscle spasm  HPI: Hayley Mills is a 49 y.o. female with medical history significant of hypthyroidism, HLD who when putting on her bathing suit top was bitten on the left breast by a black widow spider. She had the onset of severe pain and muscle spasm for which she presents to [facility]-ED.   ED Course: Afebrile, VSS. Initial lab results normal except for glucose of 133 (last A1C 5.9% 08/09/2019). For pain she has been administered MS, Fentanyl, and for muscle spasm she was given lorazepam IV. She continues to have severe pain. [facility] called to admit for on-going pain mgt. ED-PA did contact poison control who recommended AGAINST giving antivenom but to continue with pain control and mgt of muscle spasm.   Review of Systems: As per HPI otherwise 10 point review of systems negative.    Past Medical History:  Diagnosis Date  . Anxiety   . Hypertriglyceridemia   . Vitamin D deficiency     Past Surgical History:  Procedure Laterality Date  . BREAST BIOPSY Left 04/28/2017   pseudoangiomatous stromal hyperplasia ([place])   . CESAREAN SECTION    . TUBAL LIGATION     Soc Hx - married, 4 children: daughter 56, 3 sons aged 34, 26, 6. He 8 y/o is at [facility] for heart surgery and liver transplant. 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   reports that she has never smoked. She has never used smokeless tobacco. She reports that she does not drink alcohol and does not use drugs.  No Known Allergies  Family History  Problem  Relation Age of Onset  . Diabetes Father   . Hypertension Father   . Kidney cancer Father   . Breast cancer Paternal Grandmother 70    Prior to Admission medications   Medication Sig Start Date End Date Taking? Authorizing Provider  buPROPion (WELLBUTRIN XL) 150 MG 24 hr tablet Take 1 tablet (150 mg total) by mouth daily. Patient not taking: Reported on 08/09/2019 08/23/18   Gae Dry, MD  levothyroxine (SYNTHROID) 88 MCG tablet TAKE 1 TABLET BY MOUTH EVERY DAY BEFORE BREAKFAST 08/24/19   Gae Dry, MD  Naltrexone-Bupropion HCl ER (CONTRAVE) 8-90 MG TB12 Take 2 tablets by mouth 2 (two) times daily. Patient not taking: Reported on 08/09/2019 08/12/17   Gae Dry, MD    Physical Exam: Vitals:   03/25/20 1621 03/25/20 1622 03/25/20 1709 03/25/20 1839  BP:  (!) 148/88 (!) 145/96   Pulse:  87 77 80  Resp:  18 16 20   Temp:  98.1 F (36.7 C)    SpO2:  100% 99% 100%  Weight: 88.5 kg     Height: 5\' 8"  (1.727 m)       Constitutional: NAD, calm, comfortable Vitals:   03/25/20 1621 03/25/20 1622 03/25/20 1709 03/25/20 1839  BP:  (!) 148/88 (!) 145/96   Pulse:  87 77 80  Resp:  18 16 20   Temp:  98.1 F (36.7 C)    SpO2:  100% 99% 100%  Weight: 88.5 kg     Height: 5'  8" (1.727 m)      General: overweight woman who is uncomfortable due to wide-spread muscle cramps. Eyes: PERRL, lids and conjunctivae normal ENMT: Mucous membranes are moist. Normal dentition.  Neck: normal, supple, no masses, no thyromegaly Respiratory: clear to auscultation bilaterally, no wheezing, no crackles. Normal respiratory effort. No accessory muscle use.  Cardiovascular: Regular rate and rhythm, no murmurs / rubs / gallops. No extremity edema. 2+ pedal pulses. No carotid bruits.  Abdomen: no tenderness, no masses palpated. No hepatosplenomegaly. Bowel sounds positive.  Musculoskeletal: no clubbing / cyanosis. No joint deformity upper and lower extremities. Good ROM, no contractures. Normal  muscle tone.  Skin: Area of ink demarcation lateral aspect of left breast with marked reduction in erythema. 1 cm erythematous spot very lateral aspect of the left breast w/o open wound Neurologic: CN 2-12 grossly intact. Sensation intact, DTR normal. Strength 5/5 in all 4.  Psychiatric: Normal judgment and insight. Alert and oriented x 3. Normal mood.     Labs on Admission: I have personally reviewed following labs and imaging studies  CBC: Recent Labs  Lab 03/25/20 1701  WBC 8.2  NEUTROABS 4.5  HGB 14.1  HCT 42.1  MCV 85.7  PLT 601   Basic Metabolic Panel: Recent Labs  Lab 03/25/20 1701  NA 137  K 3.6  CL 101  CO2 24  GLUCOSE 133*  BUN 10  CREATININE 0.88  CALCIUM 9.3   GFR: Estimated Creatinine Clearance: 91 mL/min (by C-G formula based on SCr of 0.88 mg/dL). Liver Function Tests: Recent Labs  Lab 03/25/20 1701  AST 80*  ALT 110*  ALKPHOS 70  BILITOT 0.6  PROT 7.9  ALBUMIN 4.4   No results for input(s): LIPASE, AMYLASE in the last 168 hours. No results for input(s): AMMONIA in the last 168 hours. Coagulation Profile: Recent Labs  Lab 03/25/20 1701  INR 0.9   Cardiac Enzymes: No results for input(s): CKTOTAL, CKMB, CKMBINDEX, TROPONINI in the last 168 hours. BNP (last 3 results) No results for input(s): PROBNP in the last 8760 hours. HbA1C: No results for input(s): HGBA1C in the last 72 hours. CBG: No results for input(s): GLUCAP in the last 168 hours. Lipid Profile: No results for input(s): CHOL, HDL, LDLCALC, TRIG, CHOLHDL, LDLDIRECT in the last 72 hours. Thyroid Function Tests: No results for input(s): TSH, T4TOTAL, FREET4, T3FREE, THYROIDAB in the last 72 hours. Anemia Panel: No results for input(s): VITAMINB12, FOLATE, FERRITIN, TIBC, IRON, RETICCTPCT in the last 72 hours. Urine analysis: No results found for: COLORURINE, APPEARANCEUR, LABSPEC, PHURINE, GLUCOSEU, HGBUR, BILIRUBINUR, KETONESUR, PROTEINUR, UROBILINOGEN, NITRITE,  LEUKOCYTESUR  Radiological Exams on Admission: No results found.  EKG: Independently reviewed. No EKG on record  Assessment/Plan Active Problems:   Spider bite   Overweight (BMI 25.0-29.9)   Hypothyroidism   Black widow spider bite  (please populate well all problems here in Problem List. (For example, if patient is on BP meds at home and you resume or decide to hold them, it is a problem that needs to be her. Same for CAD, COPD, HLD and so on)   1. Spider bite - limited envenomation based on decreased erythema and no open wound. Patient is having continue muscle spasm and some pain. Plan Observation to med surg floor  Pain mgt - toradol 30 mg IV q 6, MS 2 mg q4 prn break-thru severe pain  Muscle spasm - diazepam 5 mg q6  Monitor for skin break-down at spider bite site  2. Hypothyroidism - plan to  continue home meds.   DVT prophylaxis: Lovenox  Code Status: full code  Family Communication: husband present during interveiw and exam. Answered all questions.  Disposition Plan: home in 24-48 hrs  Consults called: none (with names) Admission status: obs    Adella Hare MD [facility] Pager [phone]  If 7PM-7AM, please contact night-coverage [email or web address] Password [facility]  03/25/2020, 7:14 PM

## 2020-03-25 NOTE — ED Notes (Signed)
Transferring pt to room 148A

## 2020-03-25 NOTE — ED Triage Notes (Signed)
Pt comes via POV from home with c/o black widow spider bite to left boob. Pt states she felt something and went inside and found the spider inside her bathing suit.  Pt has swelling and redness noted to area.

## 2020-03-26 DIAGNOSIS — E039 Hypothyroidism, unspecified: Secondary | ICD-10-CM | POA: Diagnosis present

## 2020-03-26 DIAGNOSIS — F419 Anxiety disorder, unspecified: Secondary | ICD-10-CM | POA: Diagnosis present

## 2020-03-26 DIAGNOSIS — M62838 Other muscle spasm: Secondary | ICD-10-CM | POA: Diagnosis present

## 2020-03-26 DIAGNOSIS — Z8051 Family history of malignant neoplasm of kidney: Secondary | ICD-10-CM | POA: Diagnosis not present

## 2020-03-26 DIAGNOSIS — T63311A Toxic effect of venom of black widow spider, accidental (unintentional), initial encounter: Principal | ICD-10-CM

## 2020-03-26 DIAGNOSIS — R252 Cramp and spasm: Secondary | ICD-10-CM | POA: Diagnosis present

## 2020-03-26 DIAGNOSIS — R202 Paresthesia of skin: Secondary | ICD-10-CM | POA: Diagnosis present

## 2020-03-26 DIAGNOSIS — J449 Chronic obstructive pulmonary disease, unspecified: Secondary | ICD-10-CM | POA: Diagnosis present

## 2020-03-26 DIAGNOSIS — R531 Weakness: Secondary | ICD-10-CM | POA: Diagnosis present

## 2020-03-26 DIAGNOSIS — R200 Anesthesia of skin: Secondary | ICD-10-CM | POA: Diagnosis present

## 2020-03-26 DIAGNOSIS — Z803 Family history of malignant neoplasm of breast: Secondary | ICD-10-CM | POA: Diagnosis not present

## 2020-03-26 DIAGNOSIS — Z8249 Family history of ischemic heart disease and other diseases of the circulatory system: Secondary | ICD-10-CM | POA: Diagnosis not present

## 2020-03-26 DIAGNOSIS — Z79890 Hormone replacement therapy (postmenopausal): Secondary | ICD-10-CM | POA: Diagnosis not present

## 2020-03-26 DIAGNOSIS — E781 Pure hyperglyceridemia: Secondary | ICD-10-CM | POA: Diagnosis present

## 2020-03-26 DIAGNOSIS — E785 Hyperlipidemia, unspecified: Secondary | ICD-10-CM | POA: Diagnosis present

## 2020-03-26 DIAGNOSIS — I2510 Atherosclerotic heart disease of native coronary artery without angina pectoris: Secondary | ICD-10-CM | POA: Diagnosis present

## 2020-03-26 DIAGNOSIS — E559 Vitamin D deficiency, unspecified: Secondary | ICD-10-CM | POA: Diagnosis present

## 2020-03-26 DIAGNOSIS — Z20822 Contact with and (suspected) exposure to covid-19: Secondary | ICD-10-CM | POA: Diagnosis present

## 2020-03-26 DIAGNOSIS — Z79899 Other long term (current) drug therapy: Secondary | ICD-10-CM | POA: Diagnosis not present

## 2020-03-26 DIAGNOSIS — Z833 Family history of diabetes mellitus: Secondary | ICD-10-CM | POA: Diagnosis not present

## 2020-03-26 DIAGNOSIS — E663 Overweight: Secondary | ICD-10-CM | POA: Diagnosis present

## 2020-03-26 DIAGNOSIS — R2681 Unsteadiness on feet: Secondary | ICD-10-CM | POA: Diagnosis present

## 2020-03-26 LAB — HIV ANTIBODY (ROUTINE TESTING W REFLEX): HIV Screen 4th Generation wRfx: NONREACTIVE

## 2020-03-26 MED ORDER — MORPHINE SULFATE (PF) 2 MG/ML IV SOLN
2.0000 mg | INTRAVENOUS | Status: DC | PRN
  Administered 2020-03-26 (×5): 2 mg via INTRAVENOUS
  Filled 2020-03-26 (×4): qty 1

## 2020-03-26 MED ORDER — OXYCODONE-ACETAMINOPHEN 5-325 MG PO TABS
1.0000 | ORAL_TABLET | ORAL | Status: DC | PRN
  Administered 2020-03-26 – 2020-03-27 (×4): 1 via ORAL
  Filled 2020-03-26 (×5): qty 1

## 2020-03-26 NOTE — Progress Notes (Signed)
PROGRESS NOTE    Hayley Mills  KAJ:[id number] DOB: Aug 22, 1971 DOA: 03/25/2020 PCP: Gae Dry, MD   Chief complaint.  Muscle spasm.  Brief Narrative:  Hayley Mills is a 49 y.o. female with medical history significant of hypthyroidism, HLD who when putting on her bathing suit top was bitten on the left breast by a black widow spider. She had the onset of severe pain and muscle spasm for which she presents to [facility].  7/19.  Patient continued to complain muscle spasm involving her legs and arms.  she also has leg weakness.   Assessment & Plan:   Active Problems:   Overweight (BMI 25.0-29.9)   Hypothyroidism   Spider bite   Black widow spider bite  #1.  Spider bite. Reviewed up-to-date information, patient condition is consistent with mild envenomation.  No indication for antivenom treatment.  Patient still has significant leg pain, muscle spasm.  She has an unsteady gait when she walks.  We will continue symptomatic treatment. Patient had a tetanus shots as a child, she also had a Boost shot shot a year ago.  No need for additional tetanus shot. She will be seen by physical therapy today for leg weakness.  2.  Hypothyroidism.  Continue home medicines.   DVT prophylaxis: Lovenox Code Status: Full Family Communication: husband in the room Disposition Plan:  . Patient came from: Home           . Anticipated d/c place:Home . Barriers to d/c OR conditions which need to be met to effect a safe d/c:   Consultants:   None  Procedures: None Antimicrobials:None  Subjective: Patient continue to have a significant muscle spasm involving her arms and legs.  Left breast area did not see any redness anymore.  She was not able to walk very well today, physical therapy is seeing her.  Objective: Vitals:   03/25/20 2020 03/26/20 0044 03/26/20 0412 03/26/20 0740  BP: 137/90 130/73 (!) 141/100 (!) 134/94  Pulse: 80 73 71 76  Resp: _0 Temp: 97.6 F (36.4 C) 98.6 F (37 C) 97.6 F (36.4 C) 98.1 F (36.7 C)  TempSrc: Oral Oral  Oral  SpO2: 100%  97% 96%  Weight:      Height:        Intake/Output Summary (Last 24 hours) at 03/26/2020 1406 Last data filed at 03/26/2020 1048 Gross per 24 hour  Intake 416 ml  Output --  Net 416 ml   Filed Weights   03/25/20 1621  Weight: 88.5 kg    Examination:  General exam: Appears calm and comfortable  Respiratory system: Clear to auscultation. Respiratory effort normal. Cardiovascular system: S1 & S2 heard, RRR. No JVD, murmurs, rubs, gallops or clicks. No pedal edema. Gastrointestinal system: Abdomen is nondistended, soft and nontender. No organomegaly or masses felt. Normal bowel sounds heard. Central nervous system: Alert and oriented. No focal neurological deficits. Extremities: Symmetric 5 x 5 power. Skin: No rashes, lesions or ulcers Psychiatry: Judgement and insight appear normal. Mood & affect appropriate.     Data Reviewed: I have personally reviewed following labs and imaging studies  CBC: Recent Labs  Lab 03/25/20 1701  WBC 8.2  NEUTROABS 4.5  HGB 14.1  HCT 42.1  MCV 85.7  PLT 035   Basic Metabolic Panel: Recent Labs  Lab 03/25/20 1701  NA 137  K 3.6  CL 101  CO2 24  GLUCOSE 133*  BUN 10  CREATININE 0.88  CALCIUM 9.3  GFR: Estimated Creatinine Clearance: 91 mL/min (by C-G formula based on SCr of 0.88 mg/dL). Liver Function Tests: Recent Labs  Lab 03/25/20 1701  AST 80*  ALT 110*  ALKPHOS 70  BILITOT 0.6  PROT 7.9  ALBUMIN 4.4   No results for input(s): LIPASE, AMYLASE in the last 168 hours. No results for input(s): AMMONIA in the last 168 hours. Coagulation Profile: Recent Labs  Lab 03/25/20 1701  INR 0.9   Cardiac Enzymes: No results for input(s): CKTOTAL, CKMB, CKMBINDEX, TROPONINI in the last 168 hours. BNP (last 3 results) No results for input(s): PROBNP in the last 8760 hours. HbA1C: No results for input(s):  HGBA1C in the last 72 hours. CBG: No results for input(s): GLUCAP in the last 168 hours. Lipid Profile: No results for input(s): CHOL, HDL, LDLCALC, TRIG, CHOLHDL, LDLDIRECT in the last 72 hours. Thyroid Function Tests: No results for input(s): TSH, T4TOTAL, FREET4, T3FREE, THYROIDAB in the last 72 hours. Anemia Panel: No results for input(s): VITAMINB12, FOLATE, FERRITIN, TIBC, IRON, RETICCTPCT in the last 72 hours. Sepsis Labs: No results for input(s): PROCALCITON, LATICACIDVEN in the last 168 hours.  Recent Results (from the past 240 hour(s))  SARS Coronavirus 2 by RT PCR (hospital order, performed in [facility] hospital lab) Nasopharyngeal Nasopharyngeal Swab     Status: None   Collection Time: 03/25/20  6:28 PM   Specimen: Nasopharyngeal Swab  Result Value Ref Range Status   SARS Coronavirus 2 NEGATIVE NEGATIVE Final    Comment: (NOTE) SARS-CoV-2 target nucleic acids are NOT DETECTED.  The SARS-CoV-2 RNA is generally detectable in upper and lower respiratory specimens during the acute phase of infection. The lowest concentration of SARS-CoV-2 viral copies this assay can detect is 250 copies / mL. A negative result does not preclude SARS-CoV-2 infection and should not be used as the sole basis for treatment or other patient management decisions.  A negative result may occur with improper specimen collection / handling, submission of specimen other than nasopharyngeal swab, presence of viral mutation(s) within the areas targeted by this assay, and inadequate number of viral copies (<250 copies / mL). A negative result must be combined with clinical observations, patient history, and epidemiological information.  Fact Sheet for Patients:   StrictlyIdeas.no  Fact Sheet for Healthcare Providers: BankingDealers.co.za  This test is not yet approved or  cleared by the [place] FDA and has been authorized for detection and/or  diagnosis of SARS-CoV-2 by FDA under an Emergency Use Authorization (EUA).  This EUA will remain in effect (meaning this test can be used) for the duration of the COVID-19 declaration under Section 564(b)(1) of the Act, 21 U.S.C. section 360bbb-3(b)(1), unless the authorization is terminated or revoked sooner.  Performed at [facility], [address] [place], [place] [address]          Radiology Studies: No results found.      Scheduled Meds: . buPROPion  150 mg Oral Daily  . diazepam  5 mg Oral Q6H  . enoxaparin (LOVENOX) injection  40 mg Subcutaneous Q24H  . ketorolac  30 mg Intravenous Q6H  . levothyroxine  88 mcg Oral Q0600  . methocarbamol  500 mg Oral Once  . sodium chloride flush  3 mL Intravenous Q12H   Continuous Infusions: . sodium chloride       LOS: 0 days    Time spent: 25 minutes    Sharen Hones, MD [facility]   To contact the attending provider between 7A-7P or the  covering provider during after hours 7P-7A, please log into the web site [email or web address] and access using universal [place] password for that web site. If you do not have the password, please call the hospital operator.  03/26/2020, 2:06 PM

## 2020-03-26 NOTE — Progress Notes (Signed)
Pt experiencing some break through pain. Nurse Practitioner notified.

## 2020-03-26 NOTE — Progress Notes (Signed)
The swelling and redness to the bite has decreased. Pt able to get some relief with current pain regimen. Able to sleep in between care.

## 2020-03-26 NOTE — Evaluation (Signed)
Physical Therapy Evaluation Patient Details Name: Hayley Mills MRN: [id number] DOB: 1971-03-15 Today's Date: 03/26/2020   History of Present Illness  Pt is a 49 y.o. female presenting to hospital 7/18 wtih suspected black widow bite to L breast area.  Pt with muscle cramping, nausea, numbness, and tingling across face.  PMH includes anxiety, Vitamen D deficiency.  Clinical Impression  Prior to hospital admission, pt was independent; lives with family.  Currently pt is modified independent with bed mobility; CGA with transfers; and CGA ambulating 20 feet with RW.  Limited distance ambulating d/t 10/10 pain in B feet, B LE's and B wrists/hands (nurse brought pt pain medication and pt's pain 7-8/10 resting in bed end of session).  Pt would benefit from skilled PT to address noted impairments and functional limitations (see below for any additional details).  Upon hospital discharge, pt may benefit from OP PT pending pt progress and symptom improvement.    Follow Up Recommendations Outpatient PT (pending progress)    Equipment Recommendations  Rolling walker with 5" wheels    Recommendations for Other Services       Precautions / Restrictions Restrictions Weight Bearing Restrictions: No      Mobility  Bed Mobility Overal bed mobility: Modified Independent             General bed mobility comments: Supine to/from sitting edge of bed  Transfers Overall transfer level: Needs assistance Equipment used: Rolling walker (2 wheeled) Transfers: Sit to/from Stand Sit to Stand: Min guard         General transfer comment: increased effort/time to perform on own; vc's for walker use/hand placement  Ambulation/Gait Ambulation/Gait assistance: Min guard Gait Distance (Feet): 20 Feet Assistive device: Rolling walker (2 wheeled)   Gait velocity: decreased   General Gait Details: partial step through gait pattern; limited distance d/t UE/LE pain  Stairs             Wheelchair Mobility    Modified Rankin (Stroke Patients Only)       Balance Overall balance assessment: Needs assistance Sitting-balance support: No upper extremity supported;Feet supported Sitting balance-Leahy Scale: Normal Sitting balance - Comments: steady sitting reaching outside BOS   Standing balance support: No upper extremity supported Standing balance-Leahy Scale: Fair Standing balance comment: steady static standing (although pt with B knees and hips mildly flexed)                             Pertinent Vitals/Pain Pain Assessment: 0-10 Pain Score: 8  Pain Location: wrists; feet; LE's; hips Pain Descriptors / Indicators:  (pins/needles alternating with burning on feet; toes ache; hands painful) Pain Intervention(s): Limited activity within patient's tolerance;Monitored during session;Premedicated before session;Repositioned;RN gave pain meds during session    Home Living Family/patient expects to be discharged to:: Private residence Living Arrangements: Spouse/significant other;Children (29 y.o. and almost 97 y.o. son) Available Help at Discharge: Family Type of Home: House Home Access: Stairs to enter (gravel driveway to entrance) Entrance Stairs-Rails: Psychiatric nurse of Steps: 4-5 platform steps Home Layout: One level Home Equipment: None      Prior Function Level of Independence: Independent               Hand Dominance        Extremity/Trunk Assessment   Upper Extremity Assessment Upper Extremity Assessment:  (good B hand grip strength; at least 3/5 AROM B elbow flexion/extension and shoulder flexion)    Lower Extremity Assessment  Lower Extremity Assessment: RLE deficits/detail;LLE deficits/detail RLE Deficits / Details: at least 3/5 R hip flexion, knee flexion/extension, and DF/PF RLE: Unable to fully assess due to pain LLE Deficits / Details: at least 3-/5 L hip flexion; at least 3/5 knee flexion/extension  and DF/PF LLE: Unable to fully assess due to pain    Cervical / Trunk Assessment Cervical / Trunk Assessment: Normal  Communication   Communication: No difficulties  Cognition Arousal/Alertness: Awake/alert Behavior During Therapy: WFL for tasks assessed/performed Overall Cognitive Status: Within Functional Limits for tasks assessed                                        General Comments   Nursing cleared pt for participation in physical therapy.  Pt agreeable to PT session.    Exercises     Assessment/Plan    PT Assessment Patient needs continued PT services  PT Problem List Decreased strength;Decreased activity tolerance;Decreased balance;Decreased mobility;Decreased knowledge of use of DME;Pain       PT Treatment Interventions DME instruction;Gait training;Functional mobility training;Stair training;Therapeutic activities;Therapeutic exercise;Balance training;Patient/family education    PT Goals (Current goals can be found in the Care Plan section)  Acute Rehab PT Goals Patient Stated Goal: to improve symptoms PT Goal Formulation: With patient Time For Goal Achievement: 04/09/20 Potential to Achieve Goals: Fair    Frequency Min 2X/week   Barriers to discharge        Co-evaluation               AM-PAC PT "6 Clicks" Mobility  Outcome Measure Help needed turning from your back to your side while in a flat bed without using bedrails?: None Help needed moving from lying on your back to sitting on the side of a flat bed without using bedrails?: None Help needed moving to and from a bed to a chair (including a wheelchair)?: A Little Help needed standing up from a chair using your arms (e.g., wheelchair or bedside chair)?: A Little Help needed to walk in hospital room?: A Little Help needed climbing 3-5 steps with a railing? : A Little 6 Click Score: 20    End of Session Equipment Utilized During Treatment: Gait belt Activity Tolerance: Patient  limited by pain Patient left: in bed;with call bell/phone within reach Nurse Communication: Mobility status;Precautions;Other (comment) (pt's pain status) PT Visit Diagnosis: Other abnormalities of gait and mobility (R26.89);Muscle weakness (generalized) (M62.81);Difficulty in walking, not elsewhere classified (R26.2);Pain Pain - Right/Left: Left (Right) Pain - part of body: Ankle and joints of foot    Time: 9458-5929 PT Time Calculation (min) (ACUTE ONLY): 32 min   Charges:   PT Evaluation $PT Eval Low Complexity: 1 Low PT Treatments $Therapeutic Activity: 8-22 mins       Leitha Bleak, PT 03/26/20, 5:33 PM

## 2020-03-26 NOTE — Plan of Care (Signed)

## 2020-03-27 MED ORDER — TRAMADOL HCL 50 MG PO TABS: 50.0000 mg | ORAL_TABLET | Freq: Four times a day (QID) | ORAL | 0 refills | Status: AC | PRN

## 2020-03-27 NOTE — Plan of Care (Signed)
  Problem: Education: Goal: Knowledge of General Education information will improve Description: Including pain rating scale, medication(s)/side effects and non-pharmacologic comfort measures 03/27/2020 1146 by Ferrel Logan, RN Outcome: Adequate for Discharge 03/27/2020 1015 by Ferrel Logan, RN Outcome: Progressing   Problem: Health Behavior/Discharge Planning: Goal: Ability to manage health-related needs will improve 03/27/2020 1146 by Ferrel Logan, RN Outcome: Adequate for Discharge 03/27/2020 1015 by Ferrel Logan, RN Outcome: Progressing   Problem: Clinical Measurements: Goal: Ability to maintain clinical measurements within normal limits will improve 03/27/2020 1146 by Ferrel Logan, RN Outcome: Adequate for Discharge 03/27/2020 1015 by Ferrel Logan, RN Outcome: Progressing Goal: Will remain free from infection 03/27/2020 1146 by Ferrel Logan, RN Outcome: Adequate for Discharge 03/27/2020 1015 by Ferrel Logan, RN Outcome: Progressing Goal: Diagnostic test results will improve 03/27/2020 1146 by Ferrel Logan, RN Outcome: Adequate for Discharge 03/27/2020 1015 by Ferrel Logan, RN Outcome: Progressing Goal: Respiratory complications will improve 03/27/2020 1146 by Ferrel Logan, RN Outcome: Adequate for Discharge 03/27/2020 1015 by Ferrel Logan, RN Outcome: Progressing Goal: Cardiovascular complication will be avoided 03/27/2020 1146 by Ferrel Logan, RN Outcome: Adequate for Discharge 03/27/2020 1015 by Ferrel Logan, RN Outcome: Adequate for Discharge   Problem: Activity: Goal: Risk for activity intolerance will decrease 03/27/2020 1146 by Ferrel Logan, RN Outcome: Adequate for Discharge 03/27/2020 1015 by Ferrel Logan, RN Outcome: Progressing   Problem: Nutrition: Goal: Adequate nutrition will be maintained 03/27/2020 1146 by Ferrel Logan, RN Outcome: Adequate for Discharge 03/27/2020 1015 by Ferrel Logan, RN Outcome: Progressing   Problem:  Coping: Goal: Level of anxiety will decrease 03/27/2020 1146 by Ferrel Logan, RN Outcome: Adequate for Discharge 03/27/2020 1015 by Ferrel Logan, RN Outcome: Progressing   Problem: Elimination: Goal: Will not experience complications related to bowel motility 03/27/2020 1146 by Ferrel Logan, RN Outcome: Adequate for Discharge 03/27/2020 1015 by Ferrel Logan, RN Outcome: Progressing Goal: Will not experience complications related to urinary retention 03/27/2020 1146 by Ferrel Logan, RN Outcome: Adequate for Discharge 03/27/2020 1015 by Ferrel Logan, RN Outcome: Progressing   Problem: Pain Managment: Goal: General experience of comfort will improve 03/27/2020 1146 by Ferrel Logan, RN Outcome: Adequate for Discharge 03/27/2020 1015 by Ferrel Logan, RN Outcome: Progressing   Problem: Safety: Goal: Ability to remain free from injury will improve 03/27/2020 1146 by Ferrel Logan, RN Outcome: Adequate for Discharge 03/27/2020 1015 by Ferrel Logan, RN Outcome: Progressing   Problem: Skin Integrity: Goal: Risk for impaired skin integrity will decrease 03/27/2020 1146 by Ferrel Logan, RN Outcome: Adequate for Discharge 03/27/2020 1015 by Ferrel Logan, RN Outcome: Progressing

## 2020-03-27 NOTE — Progress Notes (Signed)
Pt 7/10 pain, anxiety and prickling in feet. RN gave PRN pain med and scheduled diazepam. PT to dc today. RN notified Sharen Hones MD about pt's request for home prescription. Waiting on MD response.

## 2020-03-27 NOTE — Progress Notes (Signed)
RN went over discharge summary and education with pt. Pt denies any further questions or needs. IV removed. Pt ready for discharge.

## 2020-03-27 NOTE — Plan of Care (Signed)

## 2020-03-27 NOTE — Plan of Care (Signed)

## 2020-03-27 NOTE — Discharge Summary (Signed)
Physician Discharge Summary  Patient ID: Hayley Mills MRN: [id number] DOB/AGE: 48-Jul-1972 48 y.o.  Admit date: 03/25/2020 Discharge date: 03/27/2020  Admission Diagnoses:  Discharge Diagnoses:  Active Problems:   Overweight (BMI 25.0-29.9)   Hypothyroidism   Spider bite   Black widow spider bite   Discharged Condition: good  Hospital Course:  Hayley Mills a 49 y.o.femalewith medical history significant ofhypthyroidism, HLD who when putting on her bathing suit top was bitten on the left breast by a black widow spider. She had the onset of severe pain and muscle spasm for which she presents to [facility].  7/19.  Patient continued to complain muscle spasm involving her legs and arms.  she also has leg weakness.  7/20.  Symptom has resolved today.  Patient was able to walk without assist.  Leg pain and muscle spasm essentially resolved.  Patient is medically stable to be discharged.  Patient is advised to follow-up with PCP in 1 week time.   Consults: None  Significant Diagnostic Studies:   Treatments: Symptomatic management.  Discharge Exam: Blood pressure 121/86, pulse 84, temperature 98.3 F (36.8 C), temperature source Oral, resp. rate 16, height 5\' 8"  (1.727 m), weight 88.5 kg, SpO2 98 %. General appearance: alert and cooperative Resp: clear to auscultation bilaterally Cardio: regular rate and rhythm, S1, S2 normal, no murmur, click, rub or gallop GI: soft, non-tender; bowel sounds normal; no masses,  no organomegaly Extremities: extremities normal, atraumatic, no cyanosis or edema  Disposition: Discharge disposition: 01-Home or Self Care       Discharge Instructions    Diet - low sodium heart healthy   Complete by: As directed    Increase activity slowly   Complete by: As directed      Allergies as of 03/27/2020   No Known Allergies     Medication List    TAKE these medications   levothyroxine 88 MCG tablet Commonly  known as: SYNTHROID TAKE 1 TABLET BY MOUTH EVERY DAY BEFORE BREAKFAST What changed: See the new instructions.       Follow-up Information    Gae Dry, MD Follow up in 1 week(s).   Specialty: Obstetrics and Gynecology Contact information: [address] [place] [address] [phone]               Signed: Sharen Hones 03/27/2020, 9:58 AM

## 2020-04-02 ENCOUNTER — Ambulatory Visit: Payer: BC Managed Care – PPO | Admitting: Obstetrics & Gynecology

## 2020-05-12 ENCOUNTER — Other Ambulatory Visit: Payer: Self-pay | Admitting: Obstetrics & Gynecology

## 2020-05-16 ENCOUNTER — Telehealth: Payer: Self-pay

## 2020-05-16 NOTE — Telephone Encounter (Signed)
Pt calling; needs to talk to [facility] about her levothyroxin.  She is stuck in [place] c her son who is waiting for a heart and liver tx; needs refill but not able to do blood test.  She doesn't know what to do; thought tx would have happened by now as they have been there since July.  [phone]  Adv pt that on 05/14/20 it was refilled 90d c 2rf.  Pt will call [facility] and have [facility] a [facility] in [place].

## 2020-07-17 ENCOUNTER — Telehealth: Payer: Self-pay

## 2020-07-17 NOTE — Telephone Encounter (Signed)
Pt calling; wants to talk to [facility] nurse about what's going on; is in [place] with her son who has had a heart tx and liver tx 10/14 and 15 - 19hr surgery. [phone]  Has missed two periods; did preg test 2wk after missint the first one as well as after missing the second one - both were negative; is having several hot flashes a day, worse at night; has a little trouble sleeping past couple of years; no breast tenderness or other sxs. Pt states her thyroid is doing well; her hair is coming back; she feels good so she doesn't think this is d/t thyroid.  Adv pt to try [facility] for hot flashes.  Adv will send msg to [facility] for recommendation(s).

## 2020-07-17 NOTE — Telephone Encounter (Signed)
Left detailed msg PH agrees c [place] to start.  To let us know how things go.

## 2021-01-21 ENCOUNTER — Other Ambulatory Visit: Payer: Self-pay | Admitting: Obstetrics & Gynecology

## 2021-01-21 DIAGNOSIS — Z1231 Encounter for screening mammogram for malignant neoplasm of breast: Secondary | ICD-10-CM

## 2021-01-23 ENCOUNTER — Ambulatory Visit
Admission: RE | Admit: 2021-01-23 | Discharge: 2021-01-23 | Disposition: A | Discharge status: Home / Self Care | Payer: BC Managed Care – PPO | Source: Ambulatory Visit | Attending: Obstetrics & Gynecology | Admitting: Obstetrics & Gynecology

## 2021-01-23 ENCOUNTER — Other Ambulatory Visit: Payer: Self-pay

## 2021-01-23 DIAGNOSIS — Z1231 Encounter for screening mammogram for malignant neoplasm of breast: Secondary | ICD-10-CM | POA: Insufficient documentation

## 2021-01-24 ENCOUNTER — Encounter: Payer: Self-pay | Admitting: Obstetrics & Gynecology

## 2021-02-01 ENCOUNTER — Other Ambulatory Visit (HOSPITAL_COMMUNITY)
Admission: RE | Admit: 2021-02-01 | Discharge: 2021-02-01 | Disposition: A | Discharge status: Home / Self Care | Payer: BC Managed Care – PPO | Source: Ambulatory Visit | Attending: Obstetrics & Gynecology | Admitting: Obstetrics & Gynecology

## 2021-02-01 ENCOUNTER — Ambulatory Visit (INDEPENDENT_AMBULATORY_CARE_PROVIDER_SITE_OTHER): Payer: BC Managed Care – PPO | Admitting: Obstetrics & Gynecology

## 2021-02-01 ENCOUNTER — Other Ambulatory Visit: Payer: Self-pay

## 2021-02-01 ENCOUNTER — Encounter: Payer: Self-pay | Admitting: Obstetrics & Gynecology

## 2021-02-01 VITALS — BP 120/80 | Ht 68.0 in | Wt 201.0 lb

## 2021-02-01 DIAGNOSIS — E66811 Obesity, class 1: Secondary | ICD-10-CM

## 2021-02-01 DIAGNOSIS — E7800 Pure hypercholesterolemia, unspecified: Secondary | ICD-10-CM | POA: Diagnosis not present

## 2021-02-01 DIAGNOSIS — Z124 Encounter for screening for malignant neoplasm of cervix: Secondary | ICD-10-CM | POA: Diagnosis not present

## 2021-02-01 DIAGNOSIS — Z131 Encounter for screening for diabetes mellitus: Secondary | ICD-10-CM

## 2021-02-01 DIAGNOSIS — E039 Hypothyroidism, unspecified: Secondary | ICD-10-CM | POA: Diagnosis not present

## 2021-02-01 DIAGNOSIS — Z01419 Encounter for gynecological examination (general) (routine) without abnormal findings: Secondary | ICD-10-CM | POA: Diagnosis not present

## 2021-02-01 DIAGNOSIS — Z1321 Encounter for screening for nutritional disorder: Secondary | ICD-10-CM

## 2021-02-01 DIAGNOSIS — E669 Obesity, unspecified: Secondary | ICD-10-CM

## 2021-02-01 MED ORDER — PHENTERMINE HCL 37.5 MG PO TABS: ORAL_TABLET | ORAL | 1 refills | Status: DC

## 2021-02-01 NOTE — Patient Instructions (Signed)
Thank you for choosing [facility]. As part of our ongoing efforts to improve patient experience, we would appreciate your feedback. Please fill out the short survey that you will receive by mail or MyChart. Your opinion is important to [place]! -Dr [name]  Phentermine tablets or capsules What is this medicine? PHENTERMINE (FEN ter meen) decreases your appetite. It is used with a reduced calorie diet and exercise to help you lose weight. This medicine may be used for other purposes; ask your health care provider or pharmacist if you have questions. COMMON BRAND NAME(S): Adipex-P, Atti-Plex P, Atti-Plex P Spansule, Fastin, Lomaira, Pro-Fast, Tara-8 What should I tell my health care provider before I take this medicine? They need to know if you have any of these conditions:  agitation or nervousness  diabetes  glaucoma  heart disease  high blood pressure  history of drug abuse or addiction  history of stroke  kidney disease  lung disease called Primary Pulmonary Hypertension (PPH)  taken an MAOI like Carbex, Eldepryl, Marplan, Nardil, or Parnate in last 14 days  taking stimulant medicines for attention disorders, weight loss, or to stay awake  thyroid disease  an unusual or allergic reaction to phentermine, other medicines, foods, dyes, or preservatives  pregnant or trying to get pregnant  breast-feeding How should I use this medicine? Take this medicine by mouth with a glass of water. Follow the directions on the prescription label. Take your medicine at regular intervals. Do not take it more often than directed. Do not stop taking except on your doctor's advice. Talk to your pediatrician regarding the use of this medicine in children. While this drug may be prescribed for children 17 years or older for selected conditions, precautions do apply. Overdosage: If you think you have taken too much of this medicine contact a poison control center or emergency room at once. NOTE:  This medicine is only for you. Do not share this medicine with others. What if I miss a dose? If you miss a dose, take it as soon as you can. If it is almost time for your next dose, take only that dose. Do not take double or extra doses. What may interact with this medicine? Do not take this medicine with any of the following medications:  MAOIs like Carbex, Eldepryl, Marplan, Nardil, and Parnate This medicine may also interact with the following medications:  alcohol  certain medicines for depression, anxiety, or psychotic disorders  certain medicines for high blood pressure  linezolid  medicines for colds or breathing difficulties like pseudoephedrine or phenylephrine  medicines for diabetes  sibutramine  stimulant medicines for attention disorders, weight loss, or to stay awake This list may not describe all possible interactions. Give your health care provider a list of all the medicines, herbs, non-prescription drugs, or dietary supplements you use. Also tell them if you smoke, drink alcohol, or use illegal drugs. Some items may interact with your medicine. What should I watch for while using this medicine? Visit your doctor or health care provider for regular checks on your progress. Do not stop taking except on your health care provider's advice. You may develop a severe reaction. Your health care provider will tell you how much medicine to take. Do not take this medicine close to bedtime. It may prevent you from sleeping. You may get drowsy or dizzy. Do not drive, use machinery, or do anything that needs mental alertness until you know how this medicine affects you. Do not stand or sit up quickly,  especially if you are an older patient. This reduces the risk of dizzy or fainting spells. Alcohol may increase dizziness and drowsiness. Avoid alcoholic drinks. This medicine may affect blood sugar levels. Ask your healthcare provider if changes in diet or medicines are needed if you  have diabetes. Women should inform their health care provider if they wish to become pregnant or think they might be pregnant. Losing weight while pregnant is not advised and may cause harm to the unborn child. Talk to your health care provider for more information. What side effects may I notice from receiving this medicine? Side effects that you should report to your doctor or health care professional as soon as possible:  allergic reactions like skin rash, itching or hives, swelling of the face, lips, or tongue  breathing problems  changes in emotions or moods  changes in vision  chest pain or chest tightness  fast, irregular heartbeat  feeling faint or lightheaded  increased blood pressure  irritable  restlessness  tremors  seizures  signs and symptoms of a stroke like changes in vision; confusion; trouble speaking or understanding; severe headaches; sudden numbness or weakness of the face, arm or leg; trouble walking; dizziness; loss of balance or coordination  unusually weak or tired Side effects that usually do not require medical attention (report to your doctor or health care professional if they continue or are bothersome):  changes in taste  constipation or diarrhea  dizziness  dry mouth  headache  trouble sleeping  upset stomach This list may not describe all possible side effects. Call your doctor for medical advice about side effects. You may report side effects to FDA at 1-800-FDA-1088. Where should I keep my medicine? Keep out of the reach of children. This medicine can be abused. Keep your medicine in a safe place to protect it from theft. Do not share this medicine with anyone. Selling or giving away this medicine is dangerous and against the law. This medicine may cause harm and death if it is taken by other adults, children, or pets. Return medicine that has not been used to an official disposal site. Contact the DEA at 701-712-4054 or your  city/county government to find a site. If you cannot return the medicine, mix any unused medicine with a substance like cat litter or coffee grounds. Then throw the medicine away in a sealed container like a sealed bag or coffee can with a lid. Do not use the medicine after the expiration date. Store at room temperature between 20 and 25 degrees C (68 and 77 degrees F). Keep container tightly closed. NOTE: This sheet is a summary. It may not cover all possible information. If you have questions about this medicine, talk to your doctor, pharmacist, or health care provider.  2021 Elsevier/Gold Standard (2019-07-01 12:54:20)

## 2021-02-01 NOTE — Progress Notes (Signed)
HPI:      [name] is a 50 y.o. [phone] who LMP was No LMP recorded. (Menstrual status: Irregular Periods)., she presents today for her annual examination. The patient has no complaints today. Her son Hayley Mills is still in [place] after his Heart and Liver transplant, recovering nicely. She has concerns over irreg (spaced out) periods and weight gain.  Stress of son, as well as death of mother this year.   The patient is sexually active. Her last pap: approximate date 2018 and was normal and last mammogram: approximate date 01/2021 and was normal. The patient does perform self breast exams.  There is no notable family history of breast or ovarian cancer in her family.  The patient has regular exercise: yes.  The patient denies current symptoms of depression.    GYN History: Contraception: tubal ligation  PMHx: Past Medical History:  Diagnosis Date  . Anxiety   . Diverticulitis   . Hypertriglyceridemia   . Vitamin D deficiency    Past Surgical History:  Procedure Laterality Date  . BREAST BIOPSY Left 04/28/2017   pseudoangiomatous stromal hyperplasia ([place])   . CESAREAN SECTION    . TUBAL LIGATION     Family History  Problem Relation Age of Onset  . Diabetes Father   . Hypertension Father   . Kidney cancer Father   . Bladder Cancer Father 50  . Breast cancer Paternal Grandmother 90   Social History   Tobacco Use  . Smoking status: Never Smoker  . Smokeless tobacco: Never Used  Vaping Use  . Vaping Use: Never used  Substance Use Topics  . Alcohol use: No  . Drug use: No    Current Outpatient Medications:  .  levothyroxine (SYNTHROID) 88 MCG tablet, TAKE 1 TABLET BY MOUTH EVERY DAY BEFORE BREAKFAST, Disp: 90 tablet, Rfl: 2 .  phentermine (ADIPEX-P) 37.5 MG tablet, One tablet po in morning., Disp: 30 tablet, Rfl: 1 Allergies: Patient has no known allergies.  Review of Systems  Constitutional: Negative for chills, fever and malaise/fatigue.  HENT:  Negative for congestion, sinus pain and sore throat.   Eyes: Negative for blurred vision and pain.  Respiratory: Negative for cough and wheezing.   Cardiovascular: Negative for chest pain and leg swelling.  Gastrointestinal: Negative for abdominal pain, constipation, diarrhea, heartburn, nausea and vomiting.  Genitourinary: Negative for dysuria, frequency, hematuria and urgency.  Musculoskeletal: Negative for back pain, joint pain, myalgias and neck pain.  Skin: Negative for itching and rash.  Neurological: Negative for dizziness, tremors and weakness.  Endo/Heme/Allergies: Does not bruise/bleed easily.  Psychiatric/Behavioral: Negative for depression. The patient is not nervous/anxious and does not have insomnia.     Objective: BP 120/80   Ht 5\' 8"  (1.727 m)   Wt 201 lb (91.2 kg)   BMI 30.56 kg/m   Filed Weights   02/01/21 0813  Weight: 201 lb (91.2 kg)   Body mass index is 30.56 kg/m. Physical Exam Constitutional:      General: She is not in acute distress.    Appearance: She is well-developed.  Genitourinary:     Rectum normal.     No lesions in the vagina.     No vaginal bleeding.      Right Adnexa: not tender and no mass present.    Left Adnexa: not tender and no mass present.    No cervical motion tenderness, friability, lesion or polyp.     Uterus is not enlarged.     No uterine  mass detected. Breasts:     Right: No mass, skin change or tenderness.     Left: No mass, skin change or tenderness.    HENT:     Head: Normocephalic and atraumatic. No laceration.     Right Ear: Hearing normal.     Left Ear: Hearing normal.     Mouth/Throat:     Pharynx: Uvula midline.  Eyes:     Pupils: Pupils are equal, round, and reactive to light.  Neck:     Thyroid: No thyromegaly.  Cardiovascular:     Rate and Rhythm: Normal rate and regular rhythm.     Heart sounds: No murmur heard. No friction rub. No gallop.   Pulmonary:     Effort: Pulmonary effort is normal. No  respiratory distress.     Breath sounds: Normal breath sounds. No wheezing.  Abdominal:     General: Bowel sounds are normal. There is no distension.     Palpations: Abdomen is soft.     Tenderness: There is no abdominal tenderness. There is no rebound.  Musculoskeletal:        General: Normal range of motion.     Cervical back: Normal range of motion and neck supple.  Neurological:     Mental Status: She is alert and oriented to person, place, and time.     Cranial Nerves: No cranial nerve deficit.  Skin:    General: Skin is warm and dry.  Psychiatric:        Judgment: Judgment normal.  Vitals reviewed.     Assessment:  ANNUAL EXAM 1. Women's annual routine gynecological examination   2. Hypercholesterolemia   3. Hypothyroidism, unspecified type   4. Screening for diabetes mellitus   5. Encounter for vitamin deficiency screening   6. Screening for cervical cancer   7. Obesity (BMI 30.0-34.9)      Screening Plan:            1.  Cervical Screening-  Pap smear done today  2. Breast screening- Exam annually and mammogram>40 planned   3. Colonoscopy every 10 years, Hemoccult testing - after age 48, plan next year  4. Labs Ordered today  5. Counseling for contraception: bilateral tubal ligation   6. Hypercholesterolemia - Lipid panel   7. Hypothyroidism, unspecified type - TSH   8. Obesity (BMI 30.0-34.9) - Diet and exercise discussed, stress reduction 0 Medicine for temporay use - phentermine (ADIPEX-P) 37.5 MG tablet; One tablet po in morning.  Dispense: 30 tablet; Refill: 1     F/U  Return in about 4 weeks (around 03/01/2021) for Follow up Virtual.  Barnett Applebaum, MD, [facility], [facility] 02/01/2021  8:59 AM

## 2021-02-02 LAB — GLUCOSE, FASTING: Glucose, Plasma: 105 mg/dL — ABNORMAL HIGH (ref 65–99)

## 2021-02-02 LAB — LIPID PANEL
Chol/HDL Ratio: 5.7 ratio — ABNORMAL HIGH (ref 0.0–4.4)
Cholesterol, Total: 221 mg/dL — ABNORMAL HIGH (ref 100–199)
HDL: 39 mg/dL — ABNORMAL LOW (ref >39)
LDL Chol Calc (NIH): 144 mg/dL — ABNORMAL HIGH (ref 0–99)
Triglycerides: 211 mg/dL — ABNORMAL HIGH (ref 0–149)
VLDL Cholesterol Cal: 38 mg/dL (ref 5–40)

## 2021-02-02 LAB — TSH: TSH: 2 u[IU]/mL (ref 0.450–4.500)

## 2021-02-05 ENCOUNTER — Encounter: Payer: Self-pay | Admitting: Obstetrics & Gynecology

## 2021-02-06 LAB — CYTOLOGY - PAP
Comment: NEGATIVE
Diagnosis: NEGATIVE
High risk HPV: NEGATIVE

## 2021-02-13 ENCOUNTER — Other Ambulatory Visit: Payer: Self-pay | Admitting: Obstetrics & Gynecology

## 2021-03-04 ENCOUNTER — Encounter: Payer: Self-pay | Admitting: Obstetrics & Gynecology

## 2021-03-04 ENCOUNTER — Other Ambulatory Visit: Payer: Self-pay

## 2021-03-04 ENCOUNTER — Ambulatory Visit: Payer: BC Managed Care – PPO | Admitting: Obstetrics & Gynecology

## 2021-03-04 ENCOUNTER — Telehealth (INDEPENDENT_AMBULATORY_CARE_PROVIDER_SITE_OTHER): Payer: BC Managed Care – PPO | Admitting: Obstetrics & Gynecology

## 2021-03-04 DIAGNOSIS — E663 Overweight: Secondary | ICD-10-CM | POA: Diagnosis not present

## 2021-03-04 DIAGNOSIS — E669 Obesity, unspecified: Secondary | ICD-10-CM

## 2021-03-04 MED ORDER — PHENTERMINE HCL 37.5 MG PO TABS: ORAL_TABLET | ORAL | 1 refills | Status: DC

## 2021-03-04 NOTE — Progress Notes (Signed)
Virtual Visit via Video Note  I connected with Hayley Mills on 03/04/21 at 10:00 AM EDT by a video enabled telemedicine application and verified that I am speaking with the correct person using two identifiers.  Location: Patient: In car, parked Provider: Office   I discussed the limitations of evaluation and management by telemedicine and the availability of in person appointments. The patient expressed understanding and agreed to proceed.  History of Present Illness: Hayley Mills is a 50 y.o. who was started on                phentermine (ADIPEX-P) 37.5 MG tablet One tablet po in morning. 30 tablet 1   approximately 1 month ago due to obesity/abnormal weight gain. The patient has lost 8 pounds over the past mopnth due to meds..   She has these side effects: none.  PMHx: She  has a past medical history of Anxiety, Diverticulitis, Hypertriglyceridemia, and Vitamin D deficiency. Also,  has a past surgical history that includes Cesarean section; Tubal ligation; and Breast biopsy (Left, 04/28/2017)., family history includes Bladder Cancer (age of onset: 56) in her father; Breast cancer (age of onset: 24) in her paternal grandmother; Diabetes in her father; Hypertension in her father; Kidney cancer in her father.,  reports that she has never smoked. She has never used smokeless tobacco. She reports that she does not drink alcohol and does not use drugs.  She has a current medication list which includes the following prescription(s): levothyroxine and phentermine. Also, has No Known Allergies.  Review of Systems  All other systems reviewed and are negative.    Observations/Objective: No exam today, due to telephone eVisit due to [facility] virus restriction on elective visits and procedures.  Prior visits reviewed along with ultrasounds/labs as indicated. From Home:   Weight 193 lbs  Assessment: overweight Medication treatment is going well for  her.  Plan: Patient is continued/added to prescription appetite suppressants: Phentermine .   Will continue to assist patient in incorporating positive experiences into her life to promote a positive mental attitude.  Education given regarding appropriate lifestyle changes for weight loss, including regular physical activity, healthy coping strategies, caloric restriction, and healthy eating patterns.  The risks and benefits as well as side effects of medication, such as Phenteramine or Tenuate, is discussed.  The pros and cons of suppressing appetite and boosting metabolism is counseled.  Risks of tolerance and addiction discussed.  Use of medicine will be short term.  Pt to call with any negative side effects and agrees to keep follow up appointments.  Follow Up Instructions: 2 mos f/u planned  I discussed the assessment and treatment plan with the patient. The patient was provided an opportunity to ask questions and all were answered. The patient agreed with the plan and demonstrated an understanding of the instructions.   The patient was advised to call back or seek an in-person evaluation if the symptoms worsen or if the condition fails to improve as anticipated.  A total of 21 minutes were spent face-to-face with the patient as well as preparation, review, communication, and documentation during this encounter.   Barnett Applebaum, MD, [facility], [facility] 03/04/2021  10:18 AM

## 2021-05-08 ENCOUNTER — Other Ambulatory Visit: Payer: Self-pay | Admitting: Obstetrics & Gynecology

## 2021-05-08 DIAGNOSIS — E669 Obesity, unspecified: Secondary | ICD-10-CM

## 2021-05-08 MED ORDER — PHENTERMINE HCL 37.5 MG PO TABS: ORAL_TABLET | ORAL | 0 refills | Status: DC

## 2021-07-29 DIAGNOSIS — J329 Chronic sinusitis, unspecified: Secondary | ICD-10-CM | POA: Insufficient documentation

## 2021-10-30 ENCOUNTER — Other Ambulatory Visit: Payer: Self-pay | Admitting: Obstetrics and Gynecology

## 2021-10-30 ENCOUNTER — Telehealth: Payer: Self-pay

## 2021-10-30 DIAGNOSIS — Z1231 Encounter for screening mammogram for malignant neoplasm of breast: Secondary | ICD-10-CM

## 2021-10-30 MED ORDER — LEVOTHYROXINE SODIUM 88 MCG PO TABS: ORAL_TABLET | ORAL | 1 refills | Status: DC

## 2021-10-30 NOTE — Progress Notes (Signed)
Rx RF levo 88 mcg daily till 6/23 annual. Due for labs then.

## 2021-10-30 NOTE — Telephone Encounter (Signed)
Pls let pt know both are done. Thx.

## 2021-10-30 NOTE — Telephone Encounter (Signed)
Pt calling; levothyroxin is running out; has appt 6/5th c [facility]; had blood work done last year and the dose is correct; also needs order for mammogram at [facility].  [phone]

## 2021-10-31 NOTE — Telephone Encounter (Signed)
Left detailed msg that refill has ben sent in and mammogram order is in.

## 2021-11-06 HISTORY — PX: OTHER SURGICAL HISTORY: SHX169

## 2021-12-10 ENCOUNTER — Telehealth: Payer: Self-pay

## 2021-12-10 NOTE — Telephone Encounter (Signed)
Pt calling; her appt c [facility] is now with [facility] d/t [facility] leaving; pt is on thyroid medications; will [facility] do labs and medication for her?  [phone] ?

## 2021-12-16 ENCOUNTER — Encounter: Payer: Self-pay | Admitting: Obstetrics

## 2021-12-17 NOTE — Telephone Encounter (Signed)
Patient called inquiring about whether she can have additional lab work done at her annual. Traditionally, [name] did her CMP and Thyroid panel each year. I called the patient and we discussed her thyroid hx . She has been stable on the same dose for years. We agreed to repeat her usual labs and she is also seeking to establish care with a Primary care provider in the area. ?Hayley Mills, CNM  ?12/17/2021 11:36 AM  ? ?

## 2022-01-06 ENCOUNTER — Other Ambulatory Visit: Payer: Self-pay | Admitting: Student

## 2022-01-06 ENCOUNTER — Ambulatory Visit
Admission: RE | Admit: 2022-01-06 | Discharge: 2022-01-06 | Disposition: A | Discharge status: Home / Self Care | Payer: BC Managed Care – PPO | Source: Ambulatory Visit | Attending: Student | Admitting: Student

## 2022-01-06 DIAGNOSIS — R1032 Left lower quadrant pain: Secondary | ICD-10-CM

## 2022-01-06 MED ORDER — IOHEXOL 300 MG/ML  SOLN
100.0000 mL | Freq: Once | INTRAMUSCULAR | Status: AC | PRN
  Administered 2022-01-06: 100 mL via INTRAVENOUS

## 2022-01-24 ENCOUNTER — Emergency Department: Payer: BC Managed Care – PPO

## 2022-01-24 ENCOUNTER — Emergency Department
Admission: EM | Admit: 2022-01-24 | Discharge: 2022-01-24 | Disposition: A | Discharge status: Home / Self Care | Payer: BC Managed Care – PPO | Attending: Emergency Medicine | Admitting: Emergency Medicine

## 2022-01-24 ENCOUNTER — Other Ambulatory Visit: Payer: Self-pay

## 2022-01-24 DIAGNOSIS — K5792 Diverticulitis of intestine, part unspecified, without perforation or abscess without bleeding: Secondary | ICD-10-CM

## 2022-01-24 DIAGNOSIS — R109 Unspecified abdominal pain: Secondary | ICD-10-CM | POA: Diagnosis present

## 2022-01-24 LAB — COMPREHENSIVE METABOLIC PANEL
ALT: 26 U/L (ref 0–44)
AST: 20 U/L (ref 15–41)
Albumin: 4.4 g/dL (ref 3.5–5.0)
Alkaline Phosphatase: 87 U/L (ref 38–126)
Anion gap: 10 (ref 5–15)
BUN: 11 mg/dL (ref 6–20)
CO2: 25 mmol/L (ref 22–32)
Calcium: 9.9 mg/dL (ref 8.9–10.3)
Chloride: 103 mmol/L (ref 98–111)
Creatinine, Ser: 0.81 mg/dL (ref 0.44–1.00)
GFR, Estimated: >60 mL/min (ref >60)
Glucose, Bld: 100 mg/dL — ABNORMAL HIGH (ref 70–99)
Potassium: 3.8 mmol/L (ref 3.5–5.1)
Sodium: 138 mmol/L (ref 135–145)
Total Bilirubin: 0.8 mg/dL (ref 0.3–1.2)
Total Protein: 8.3 g/dL — ABNORMAL HIGH (ref 6.5–8.1)

## 2022-01-24 LAB — CBC
HCT: 43.2 % (ref 36.0–46.0)
Hemoglobin: 14.1 g/dL (ref 12.0–15.0)
MCH: 26.9 pg (ref 26.0–34.0)
MCHC: 32.6 g/dL (ref 30.0–36.0)
MCV: 82.4 fL (ref 80.0–100.0)
Platelets: 392 10*3/uL (ref 150–400)
RBC: 5.24 MIL/uL — ABNORMAL HIGH (ref 3.87–5.11)
RDW: 13.5 % (ref 11.5–15.5)
WBC: 12.4 10*3/uL — ABNORMAL HIGH (ref 4.0–10.5)
nRBC: 0 % (ref 0.0–0.2)

## 2022-01-24 LAB — LIPASE, BLOOD: Lipase: 29 U/L (ref 11–51)

## 2022-01-24 LAB — URINALYSIS, ROUTINE W REFLEX MICROSCOPIC
Bilirubin Urine: NEGATIVE
Glucose, UA: NEGATIVE mg/dL
Hgb urine dipstick: NEGATIVE
Ketones, ur: NEGATIVE mg/dL
Leukocytes,Ua: NEGATIVE
Nitrite: NEGATIVE
Protein, ur: NEGATIVE mg/dL
Specific Gravity, Urine: 1.008 (ref 1.005–1.030)
pH: 6 (ref 5.0–8.0)

## 2022-01-24 LAB — POC URINE PREG, ED: Preg Test, Ur: NEGATIVE

## 2022-01-24 MED ORDER — IOHEXOL 300 MG/ML  SOLN
80.0000 mL | Freq: Once | INTRAMUSCULAR | Status: AC | PRN
  Administered 2022-01-24: 80 mL via INTRAVENOUS

## 2022-01-24 MED ORDER — METRONIDAZOLE 500 MG PO TABS: 500.0000 mg | ORAL_TABLET | Freq: Three times a day (TID) | ORAL | 0 refills | Status: AC

## 2022-01-24 MED ORDER — DICYCLOMINE HCL 10 MG PO CAPS: 10.0000 mg | ORAL_CAPSULE | Freq: Three times a day (TID) | ORAL | 0 refills | Status: DC | PRN

## 2022-01-24 MED ORDER — ONDANSETRON HCL 4 MG PO TABS: 4.0000 mg | ORAL_TABLET | Freq: Three times a day (TID) | ORAL | 0 refills | Status: DC | PRN

## 2022-01-24 MED ORDER — CIPROFLOXACIN HCL 500 MG PO TABS: 500.0000 mg | ORAL_TABLET | Freq: Two times a day (BID) | ORAL | 0 refills | Status: AC

## 2022-01-24 NOTE — ED Triage Notes (Addendum)
First nurse note: Patient arrived from [facility]. Patient c/o abd pain today. Hx diverticulitis. Diagnosed with diverticulitis X2 weeks ago and just finished antibiotics. Ambulatory into ER with no issues

## 2022-01-24 NOTE — Discharge Instructions (Signed)
Please seek medical attention for any high fevers, chest pain, shortness of breath, change in behavior, persistent vomiting, bloody stool or any other new or concerning symptoms.  

## 2022-01-24 NOTE — ED Provider Notes (Signed)
[facility] Provider Note    Event Date/Time   First MD Initiated Contact with Patient 01/24/22 1758     (approximate)   History   Abdominal Pain   HPI  Kinjal Neitzke is a 51 y.o. female  who, per clinic note dated 01/06/22 started treatment for diverticulitis, who presents to the emergency department today because of concerns for continued pain.  Patient states that started having issues with diverticulitis last year.  She says over the past couple months however it has been more persistent.  She feels like when she finishes course of antibiotics will come again after couple of weeks.  She just finished a course of antibiotics a couple of days ago.  She continues to have pain in her left lower quadrant.  It does start radiating across her lower abdomen.  She denies any fevers.  Physical Exam   Triage Vital Signs: ED Triage Vitals  Enc Vitals Group     BP 01/24/22 1525 (!) 147/109     Pulse Rate 01/24/22 1525 (!) 113     Resp 01/24/22 1525 16     Temp 01/24/22 1525 98.4 F (36.9 C)     Temp Source 01/24/22 1525 Oral     SpO2 01/24/22 1525 97 %     Weight 01/24/22 1526 182 lb (82.6 kg)     Height 01/24/22 1526 '5\' 8"'$  (1.727 m)     Head Circumference --      Peak Flow --      Pain Score 01/24/22 1525 8                Most recent vital signs: Vitals:   01/24/22 1525  BP: (!) 147/109  Pulse: (!) 113  Resp: 16  Temp: 98.4 F (36.9 C)  SpO2: 97%    General: Awake, alert, oriented. CV:  Good peripheral perfusion. Regular rate and rhythm. Resp:  Normal effort. Lungs clear to auscultation. Abd:  No distention. Tender to palpation in the left lower quadrant and suprapubic region.   ED Results / Procedures / Treatments   Labs (all labs ordered are listed, but only abnormal results are displayed) Labs Reviewed  COMPREHENSIVE METABOLIC PANEL - Abnormal; Notable for the following components:      Result Value   Glucose, Bld 100 (*)     Total Protein 8.3 (*)    All other components within normal limits  CBC - Abnormal; Notable for the following components:   WBC 12.4 (*)    RBC 5.24 (*)    All other components within normal limits  URINALYSIS, ROUTINE W REFLEX MICROSCOPIC - Abnormal; Notable for the following components:   Color, Urine STRAW (*)    APPearance CLEAR (*)    All other components within normal limits  LIPASE, BLOOD  LACTIC ACID, PLASMA  LACTIC ACID, PLASMA  POC URINE PREG, ED     EKG  None   RADIOLOGY I independently interpreted and visualized the CT abd/pel. My interpretation: No free air Radiology interpretation:  IMPRESSION:  1. Persistent and progressive diverticulitis of the distal  descending colon with increased inflammatory changes. No perforation  or abscess.  2. Hepatic steatosis.         PROCEDURES:  Critical Care performed: No  Procedures   MEDICATIONS ORDERED IN ED: Medications  iohexol (OMNIPAQUE) 300 MG/ML solution 80 mL (80 mLs Intravenous Contrast Given 01/24/22 1658)     IMPRESSION / MDM / ASSESSMENT AND PLAN / ED COURSE  I  reviewed the triage vital signs and the nursing notes.                              Differential diagnosis includes, but is not limited to, urinary tract infection, diverticulitis, kidney stone.  Patient presents to the emergency department today with persistent abdominal pain.  Recently/course of Augmentin for diverticulitis.  CT scan today shows progressive disease however no perforation or abscess.  Blood work with very mild leukocytosis.  Patient is afebrile.  I did have a discussion with the patient.  We did discuss admission versus trial of different outpatient antibiotics.  At this time patient felt comfortable trying oral antibiotics and I think this is reasonable.  Will give patient prescription for Flagyl and Cipro. Did encourage patient to return for any worsening symptoms.      FINAL CLINICAL IMPRESSION(S) / ED DIAGNOSES    Final diagnoses:  Abdominal pain, unspecified abdominal location  Diverticulitis     Note:  This document was prepared using Dragon voice recognition software and may include unintentional dictation errors.    Nance Pear, MD 01/24/22 [phone]

## 2022-01-24 NOTE — ED Provider Triage Note (Signed)
Emergency Medicine Provider Triage Evaluation Note  Lennon Richins, a 51 y.o. female  was evaluated in triage.  Pt complains of abdominal pain.  Patient with a history of diverticulitis, completed a course of Augmentin and reports lower abdominal discomfort as well as soft bowel movements.  She presents to the ED from [facility] for evaluation.  Review of Systems  Positive: Abd pain Negative: FCS  Physical Exam  BP (!) 147/109 (BP Location: Left Arm)   Pulse (!) 113   Temp 98.4 F (36.9 C) (Oral)   Resp 16   Ht '5\' 8"'$  (1.727 m)   Wt 82.6 kg   SpO2 97%   BMI 27.67 kg/m  Gen:   Awake, no distress   Resp:  Normal effort  MSK:   Moves extremities without difficulty  Other:  Soft, nontender  Medical Decision Making  Medically screening exam initiated at 3:29 PM.  Appropriate orders placed.  Dameshia Seybold was informed that the remainder of the evaluation will be completed by another provider, this initial triage assessment does not replace that evaluation, and the importance of remaining in the ED until their evaluation is complete.  Patient with a history of diverticulitis, presents to the ED with persistent lower abdominal discomfort.  She denies fevers but reports chills.   Melvenia Needles, PA-C 01/24/22 1531

## 2022-01-24 NOTE — ED Triage Notes (Signed)
Pt states that she recently was treated with augmentin that she finished for diverticulitis, pt states that she has been having a lot of left lower abd pain with a lot of soft bm's

## 2022-01-27 ENCOUNTER — Telehealth: Payer: Self-pay

## 2022-01-27 ENCOUNTER — Telehealth: Payer: Self-pay | Admitting: Gastroenterology

## 2022-01-27 NOTE — Telephone Encounter (Signed)
Patient is very upset and states she is in a lot of pain. Patient was in the ED on 01/24/2022. I have scheduled an appt for soonest available with DR Allen Norris on 05/14/2022 but it also looks like she has appt with DR [facility] and [facility]. Patient requesting sooner appt and a call back.

## 2022-01-27 NOTE — Telephone Encounter (Signed)
Triage Voicemail: Patient reports she is a former patient of Dr. Barnett Applebaum. She is scheduled with Gigi Gin on 02/10/22. She needs a colonoscopy. She's having trouble getting in. She would like to got to [facility] instead of the [facility] referral that she received at the Emergency Room. Inquiring if Joycelyn Schmid can make a referral for her to [facility] and possibly get her in sooner. She has had 3 bouts of Diverticulitis since March that has not resolved. She is in severe pain on her left side even after taking days/weeks of abx. ER MD wanted her to see GI and get the colonoscopy done fairly soon.  can't see her til September and [facility] won't see her at all w/o a referral. Joycelyn Schmid had discussed with patient about getting a PCP, but she hasn't been able to get an appointment. ZB#[phone]. [facility] [phone]

## 2022-01-27 NOTE — Telephone Encounter (Signed)
Spoke with patient to notify message received. Advised Hayley Mills is not in the office until tomorrow. Discussed with patient that she is scheduled with [facility] 03/31/22. Patient relieved "that's better". Given number to contact them to get on cancellation list considering her current status. She will contact them and call us back if she still desires [facility] referral.

## 2022-01-27 NOTE — Telephone Encounter (Signed)
Earlier appt offered 6/7 10:15, pt was agreeable

## 2022-02-07 ENCOUNTER — Ambulatory Visit
Admission: RE | Admit: 2022-02-07 | Discharge: 2022-02-07 | Disposition: A | Discharge status: Home / Self Care | Payer: BC Managed Care – PPO | Source: Ambulatory Visit | Attending: Obstetrics and Gynecology | Admitting: Obstetrics and Gynecology

## 2022-02-07 DIAGNOSIS — Z1231 Encounter for screening mammogram for malignant neoplasm of breast: Secondary | ICD-10-CM | POA: Insufficient documentation

## 2022-02-10 ENCOUNTER — Ambulatory Visit: Payer: BC Managed Care – PPO | Admitting: Obstetrics and Gynecology

## 2022-02-10 ENCOUNTER — Telehealth: Payer: Self-pay

## 2022-02-10 ENCOUNTER — Other Ambulatory Visit: Payer: Self-pay | Admitting: Advanced Practice Midwife

## 2022-02-10 ENCOUNTER — Ambulatory Visit: Payer: BC Managed Care – PPO | Admitting: Obstetrics

## 2022-02-10 DIAGNOSIS — E039 Hypothyroidism, unspecified: Secondary | ICD-10-CM

## 2022-02-10 MED ORDER — LEVOTHYROXINE SODIUM 88 MCG PO TABS: ORAL_TABLET | ORAL | 1 refills | Status: DC

## 2022-02-10 NOTE — Telephone Encounter (Signed)
Can you refill her levothyroxine? She had an appointment with [facility] today but [facility] is not in the office, appointment rescheduled to 6/16. She only has 7 pills left. Pt has an appointment with a PCP but its not soon, looks like [facility] has always managed this for her. I advised her when she gets in with the PCP they should handle this from then on.

## 2022-02-10 NOTE — Telephone Encounter (Signed)
Thanks

## 2022-02-10 NOTE — Progress Notes (Signed)
Rx synthroid sent for patient. She will need to follow up with PCP for additional dosing.

## 2022-02-12 ENCOUNTER — Ambulatory Visit (INDEPENDENT_AMBULATORY_CARE_PROVIDER_SITE_OTHER): Payer: BC Managed Care – PPO | Admitting: Gastroenterology

## 2022-02-12 ENCOUNTER — Encounter: Payer: Self-pay | Admitting: Gastroenterology

## 2022-02-12 VITALS — BP 114/80 | HR 89 | Temp 98.1°F | Ht 68.0 in | Wt 180.0 lb

## 2022-02-12 DIAGNOSIS — K5792 Diverticulitis of intestine, part unspecified, without perforation or abscess without bleeding: Secondary | ICD-10-CM | POA: Diagnosis not present

## 2022-02-12 DIAGNOSIS — Z1211 Encounter for screening for malignant neoplasm of colon: Secondary | ICD-10-CM

## 2022-02-12 MED ORDER — NA SULFATE-K SULFATE-MG SULF 17.5-3.13-1.6 GM/177ML PO SOLN
1.0000 | Freq: Once | ORAL | 0 refills | Status: AC
Start: 2022-02-12 — End: 2022-02-12

## 2022-02-12 NOTE — Progress Notes (Signed)
Gastroenterology Consultation  Referring Provider:     No ref. provider found Primary Care Physician:  Gae Dry, MD (Inactive) Primary Gastroenterologist:  Dr. Allen Norris     Reason for Consultation:     Recurrent diverticulitis        HPI:   Hayley Mills is a 51 y.o. y/o female referred for consultation & management of recurrent diverticulitis by Dr. [name], Linton Ham, MD (Inactive).  This patient comes in today with a history of recurrent diverticulitis.  The patient reports that she had an attack back in [place] and was diagnosed with diverticulitis.  The patient was recently in the emergency department and had a CT scan showing:  IMPRESSION: 1. Persistent and progressive diverticulitis of the distal descending colon with increased inflammatory changes. No perforation or abscess. 2. Hepatic steatosis.  Just prior to that the patient was in the ER with distal descending: Diverticulitis.  The patient was switched from Augmentin to Cipro and Flagyl and states that that has helped her greatly.  The patient does report that she has had chronic abdominal issues including abdominal pain on the right side when she eats salads.  There is no report of any unexplained weight loss fevers chills nausea vomiting black stools or bloody stools.  The patient reports that the Cipro and Flagyl have worked much better for her than the Augmentin.  Past Medical History:  Diagnosis Date   Anxiety    Diverticulitis    Hypertriglyceridemia    Vitamin D deficiency     Past Surgical History:  Procedure Laterality Date   BREAST BIOPSY Left 04/28/2017   pseudoangiomatous stromal hyperplasia ([place])    CESAREAN SECTION     TUBAL LIGATION      Prior to Admission medications   Medication Sig Start Date End Date Taking? Authorizing Provider  levothyroxine (SYNTHROID) 88 MCG tablet TAKE 1 TABLET BY MOUTH EVERY DAY BEFORE BREAKFAST 02/10/22  Yes Rod Can, CNM    Family History   Problem Relation Age of Onset   Diabetes Father    Hypertension Father    Kidney cancer Father    Bladder Cancer Father 63   Breast cancer Paternal Grandmother 89     Social History   Tobacco Use   Smoking status: Never   Smokeless tobacco: Never  Vaping Use   Vaping Use: Never used  Substance Use Topics   Alcohol use: No   Drug use: No    Allergies as of 02/12/2022   (No Known Allergies)    Review of Systems:    All systems reviewed and negative except where noted in HPI.   Physical Exam:  BP 114/80   Pulse 89   Temp 98.1 F (36.7 C) (Oral)   Ht '5\' 8"'$  (1.727 m)   Wt 180 lb (81.6 kg)   BMI 27.37 kg/m  No LMP recorded. (Menstrual status: Irregular Periods). General:   Alert,  Well-developed, well-nourished, pleasant and cooperative in NAD Head:  Normocephalic and atraumatic. Eyes:  Sclera clear, no icterus.   Conjunctiva pink. Ears:  Normal auditory acuity. Neck:  Supple; no masses or thyromegaly. Lungs:  Respirations even and unlabored.  Clear throughout to auscultation.   No wheezes, crackles, or rhonchi. No acute distress. Heart:  Regular rate and rhythm; no murmurs, clicks, rubs, or gallops. Abdomen:  Normal bowel sounds.  No bruits.  Soft, non-tender and non-distended without masses, hepatosplenomegaly or hernias noted.  No guarding or rebound tenderness.  Negative Carnett sign.   Rectal:  Deferred.  Pulses:  Normal pulses noted. Extremities:  No clubbing or edema.  No cyanosis. Neurologic:  Alert and oriented x3;  grossly normal neurologically. Skin:  Intact without significant lesions or rashes.  No jaundice. Lymph Nodes:  No significant cervical adenopathy. Psych:  Alert and cooperative. Normal mood and affect.  Imaging Studies: CT ABDOMEN PELVIS W CONTRAST  Result Date: 01/24/2022 CLINICAL DATA:  Left lower quadrant abdominal pain. EXAM: CT ABDOMEN AND PELVIS WITH CONTRAST TECHNIQUE: Multidetector CT imaging of the abdomen and pelvis was performed  using the standard protocol following bolus administration of intravenous contrast. RADIATION DOSE REDUCTION: This exam was performed according to the departmental dose-optimization program which includes automated exposure control, adjustment of the mA and/or kV according to patient size and/or use of iterative reconstruction technique. CONTRAST:  72m OMNIPAQUE IOHEXOL 300 MG/ML  SOLN COMPARISON:  CT 2 and half weeks ago 01/06/2022 FINDINGS: Lower chest: The lung bases are clear.  The heart is normal in size. Hepatobiliary: Diffusely decreased hepatic density typical of steatosis. No focal liver abnormality. Gallbladder physiologically distended, no calcified stone. No biliary dilatation. Pancreas: No ductal dilatation or inflammation. Spleen: Normal in size without focal abnormality. Adrenals/Urinary Tract: No adrenal nodule. No hydronephrosis or perinephric edema. Homogeneous renal enhancement with symmetric excretion on delayed phase imaging. Stable cyst in the mid right kidney, needing no further follow-up. No solid lesion or renal calculi. Urinary bladder is partially distended without wall thickening. Stomach/Bowel: Stomach is essentially nondistended. Normal positioning of the duodenum and ligament of Treitz. There is progressive diverticulitis involving the distal descending colon with increased fat stranding and inflammatory change. There is mild associated colonic wall thickening that is also new. No perforation or abscess. The adjacent small bowel loops are mildly fluid-filled, likely reactive ileus. No new or progressive sites of diverticulitis. There additional noninflamed colonic diverticula throughout the colon. The appendix is normal. Vascular/Lymphatic: Normal caliber abdominal aorta. Patent portal, splenic, and mesenteric veins. There is a small lymph node in the sigmoid mesentery adjacent inflammation, likely reactive and not enlarged by size criteria. No enlarged lymph nodes in the abdomen or  pelvis. Reproductive: Uterus and bilateral adnexa are unremarkable. Other: Fat stranding and inflammation in the left lower quadrant related to diverticulitis. No perforation or abscess. Trace free fluid in the dependent pelvis. No upper abdominal ascites. There is no free air. Tiny fat containing umbilical hernia. Musculoskeletal: Stable lumbar degenerative change. There are no acute or suspicious osseous abnormalities. IMPRESSION: 1. Persistent and progressive diverticulitis of the distal descending colon with increased inflammatory changes. No perforation or abscess. 2. Hepatic steatosis. Electronically Signed   By: MKeith RakeM.D.   On: 01/24/2022 17:29   MM 3D SCREEN BREAST BILATERAL  Result Date: 02/10/2022 CLINICAL DATA:  Screening. EXAM: DIGITAL SCREENING BILATERAL MAMMOGRAM WITH TOMOSYNTHESIS AND CAD TECHNIQUE: Bilateral screening digital craniocaudal and mediolateral oblique mammograms were obtained. Bilateral screening digital breast tomosynthesis was performed. The images were evaluated with computer-aided detection. COMPARISON:  Previous exam(s). ACR Breast Density Category b: There are scattered areas of fibroglandular density. FINDINGS: There are no findings suspicious for malignancy. IMPRESSION: No mammographic evidence of malignancy. A result letter of this screening mammogram will be mailed directly to the patient. RECOMMENDATION: Screening mammogram in one year. (Code:SM-B-01Y) BI-RADS CATEGORY  1: Negative. Electronically Signed   By: SKristopher OppenheimM.D.   On: 02/10/2022 14:02    Assessment and Plan:   Hayley Linaresis a 51y.o. y/o female who comes in with recurrent diverticulitis.  The patient is doing well at the present time.  The patient has never had a colonoscopy and should be set up for screening colonoscopy.  The patient has also been told that with her recurrent diverticulitis she may consider seeing surgery after the colonoscopy to consider resection.  She  would be better served by having a colonoscopy first to see the extent of the diverticulosis prior to seeing surgery.  The patient has been told to wait at least 4 to 6 weeks after her attack of the diverticulitis to have a colonoscopy because of the risks of perforation with a screening colonoscopy.  The patient has been explained the plan and agrees with it.  Lucilla Lame, MD. Marval Regal    Note: This dictation was prepared with Dragon dictation along with smaller phrase technology. Any transcriptional errors that result from this process are unintentional.

## 2022-02-17 NOTE — Progress Notes (Signed)
PCP: Gae Dry, MD (Inactive)   Chief Complaint  Patient presents with   Gynecologic Exam    No concerns    HPI:      [name] is a 51 y.o. [phone] whose LMP was No LMP recorded (lmp unknown). (Menstrual status: Irregular Periods)., presents today for her annual examination.  Her menses are irregular since covid shot 2021; was monthly till then. Now a couple times a yr, lasting 4 days, light flow, mild dysmen, no BTB. She does have some vasomotor sx.   Sex activity: single partner, contraception - tubal ligation. She does have vaginal dryness improved with lubricants.   Last Pap: 02/01/21  Results were: no abnormalities /neg HPV DNA.   Last mammogram: 02/07/22 Results were: normal--routine follow-up in 12 months There is a FH of breast cancer in her PGM, genetic testing not indicated. There is no FH of ovarian cancer. The patient does do self-breast exams.  Colonoscopy: has appt 7/23 at [facility];    Tobacco use: The patient denies current or previous tobacco use. Alcohol use: none No drug use Exercise: occas active  She does get adequate calcium and sometimes Vitamin D in her diet. Hx of Vit D deficiency.   Has borderline lipids on  5/22 labs; hx of pre-DM and Vit D deficiency. Due for labs.  hx of hypothyroidism, on levo 88 mcg daily. Due for labs.  Doesn't have PCP but plans to get established.    Patient Active Problem List   Diagnosis Date Noted   Spider bite 03/25/2020   Black widow spider bite 03/25/2020   Vitamin D deficiency 09/16/2018   Overweight (BMI 25.0-29.9) 08/12/2017   Hypothyroidism 08/12/2017   Left lower quadrant pain 04/29/2017   Weight gain 04/29/2017   BMI 31.0-31.9,adult 04/29/2017    Past Surgical History:  Procedure Laterality Date   BREAST BIOPSY Left 04/28/2017   pseudoangiomatous stromal hyperplasia ([place])    CESAREAN SECTION     OTHER SURGICAL HISTORY  11/2021   Nasal surgery   TUBAL LIGATION      Family  History  Problem Relation Age of Onset   Diabetes Father    Hypertension Father    Kidney cancer Father    Bladder Cancer Father 73   Breast cancer Paternal Grandmother 19    Social History   Socioeconomic History   Marital status: Married    Spouse name: Not on file   Number of children: Not on file   Years of education: Not on file   Highest education level: Not on file  Occupational History   Not on file  Tobacco Use   Smoking status: Never   Smokeless tobacco: Never  Vaping Use   Vaping Use: Never used  Substance and Sexual Activity   Alcohol use: No   Drug use: No   Sexual activity: Yes    Birth control/protection: Surgical  Other Topics Concern   Not on file  Social History Narrative   Not on file   Social Determinants of Health   Financial Resource Strain: Not on file  Food Insecurity: Not on file  Transportation Needs: Not on file  Physical Activity: Not on file  Stress: Not on file  Social Connections: Not on file  Intimate Partner Violence: Not on file     Current Outpatient Medications:    levothyroxine (SYNTHROID) 88 MCG tablet, TAKE 1 TABLET BY MOUTH EVERY DAY BEFORE BREAKFAST, Disp: 90 tablet, Rfl: 1   naproxen (NAPROSYN) 500 MG  tablet, Take 500 mg by mouth 2 (two) times daily as needed., Disp: , Rfl:    ondansetron (ZOFRAN-ODT) 4 MG disintegrating tablet, Take 4 mg by mouth every 8 (eight) hours as needed., Disp: , Rfl:    promethazine (PHENERGAN) 25 MG tablet, PLEASE SEE ATTACHED FOR DETAILED DIRECTIONS, Disp: , Rfl:    Na Sulfate-K Sulfate-Mg Sulf 17.5-3.13-1.6 GM/177ML SOLN, Take by mouth as directed. (Patient not taking: Reported on 02/18/2022), Disp: , Rfl:      ROS:  Review of Systems  Constitutional:  Negative for fatigue, fever and unexpected weight change.  Respiratory:  Negative for cough, shortness of breath and wheezing.   Cardiovascular:  Negative for chest pain, palpitations and leg swelling.  Gastrointestinal:  Negative for  blood in stool, constipation, diarrhea, nausea and vomiting.  Endocrine: Negative for cold intolerance, heat intolerance and polyuria.  Genitourinary:  Negative for dyspareunia, dysuria, flank pain, frequency, genital sores, hematuria, menstrual problem, pelvic pain, urgency, vaginal bleeding, vaginal discharge and vaginal pain.  Musculoskeletal:  Negative for back pain, joint swelling and myalgias.  Skin:  Negative for rash.  Neurological:  Negative for dizziness, syncope, light-headedness, numbness and headaches.  Hematological:  Negative for adenopathy.  Psychiatric/Behavioral:  Negative for agitation, confusion, sleep disturbance and suicidal ideas. The patient is not nervous/anxious.    BREAST: No symptoms    Objective: BP 100/64   Ht '5\' 8"'$  (1.727 m)   Wt 182 lb (82.6 kg)   LMP  (LMP Unknown)   BMI 27.67 kg/m    Physical Exam Constitutional:      Appearance: She is well-developed.  Genitourinary:     Vulva normal.     Right Labia: No rash, tenderness or lesions.    Left Labia: No tenderness, lesions or rash.    No vaginal discharge, erythema or tenderness.      Right Adnexa: not tender and no mass present.    Left Adnexa: not tender and no mass present.    No cervical friability or polyp.     Uterus is not enlarged or tender.  Breasts:    Right: No mass, nipple discharge, skin change or tenderness.     Left: No mass, nipple discharge, skin change or tenderness.  Neck:     Thyroid: No thyromegaly.  Cardiovascular:     Rate and Rhythm: Normal rate and regular rhythm.     Heart sounds: Normal heart sounds. No murmur heard. Pulmonary:     Effort: Pulmonary effort is normal.     Breath sounds: Normal breath sounds.  Abdominal:     Palpations: Abdomen is soft.     Tenderness: There is no abdominal tenderness. There is no guarding or rebound.  Musculoskeletal:        General: Normal range of motion.     Cervical back: Normal range of motion.  Lymphadenopathy:      Cervical: No cervical adenopathy.  Neurological:     General: No focal deficit present.     Mental Status: She is alert and oriented to person, place, and time.     Cranial Nerves: No cranial nerve deficit.  Skin:    General: Skin is warm and dry.  Psychiatric:        Mood and Affect: Mood normal.        Behavior: Behavior normal.        Thought Content: Thought content normal.        Judgment: Judgment normal.  Vitals reviewed.     Assessment/Plan:  Encounter for annual routine gynecological examination  Encounter for screening mammogram for malignant neoplasm of breast; pt current on mammo  Acquired hypothyroidism - Plan: TSH, T4, free; check labs, will RF meds based on labs.   Blood tests for routine general physical examination - Plan: Comprehensive metabolic panel, Hemoglobin A1c, VITAMIN D 25 Hydroxy (Vit-D Deficiency, Fractures), TSH, T4, free, Lipid panel  Vitamin D deficiency - Plan: VITAMIN D 25 Hydroxy (Vit-D Deficiency, Fractures)  Pre-diabetes - Plan: Hemoglobin A1c, Lipid panel  Screening cholesterol level - Plan: Lipid panel  Screening for diabetes mellitus - Plan: Hemoglobin A1c          GYN counsel breast self exam, mammography screening, menopause, adequate intake of calcium and vitamin D, diet and exercise    F/U  Return in about 1 year (around 02/19/2023) for lab appt Thurs.  Leilana Mcquire B. Mekel Haverstock, PA-C 02/18/2022 5:12 PM

## 2022-02-18 ENCOUNTER — Encounter: Payer: Self-pay | Admitting: Obstetrics and Gynecology

## 2022-02-18 ENCOUNTER — Ambulatory Visit (INDEPENDENT_AMBULATORY_CARE_PROVIDER_SITE_OTHER): Payer: BC Managed Care – PPO | Admitting: Obstetrics and Gynecology

## 2022-02-18 VITALS — BP 100/64 | Ht 68.0 in | Wt 182.0 lb

## 2022-02-18 DIAGNOSIS — Z0000 Encounter for general adult medical examination without abnormal findings: Secondary | ICD-10-CM | POA: Diagnosis not present

## 2022-02-18 DIAGNOSIS — Z01419 Encounter for gynecological examination (general) (routine) without abnormal findings: Secondary | ICD-10-CM | POA: Diagnosis not present

## 2022-02-18 DIAGNOSIS — E039 Hypothyroidism, unspecified: Secondary | ICD-10-CM

## 2022-02-18 DIAGNOSIS — Z13220 Encounter for screening for lipoid disorders: Secondary | ICD-10-CM

## 2022-02-18 DIAGNOSIS — Z131 Encounter for screening for diabetes mellitus: Secondary | ICD-10-CM

## 2022-02-18 DIAGNOSIS — Z1231 Encounter for screening mammogram for malignant neoplasm of breast: Secondary | ICD-10-CM

## 2022-02-18 DIAGNOSIS — E559 Vitamin D deficiency, unspecified: Secondary | ICD-10-CM

## 2022-02-18 DIAGNOSIS — R7303 Prediabetes: Secondary | ICD-10-CM

## 2022-02-18 NOTE — Patient Instructions (Signed)
I value your feedback and you entrusting us with your care. If you get a  patient survey, I would appreciate you taking the time to let us know about your experience today. Thank you! ? ? ?

## 2022-02-20 ENCOUNTER — Other Ambulatory Visit: Payer: BC Managed Care – PPO

## 2022-02-21 ENCOUNTER — Other Ambulatory Visit: Payer: Self-pay

## 2022-02-21 ENCOUNTER — Ambulatory Visit: Payer: BC Managed Care – PPO | Admitting: Obstetrics

## 2022-02-21 ENCOUNTER — Other Ambulatory Visit: Payer: BC Managed Care – PPO

## 2022-02-21 ENCOUNTER — Ambulatory Visit: Payer: BC Managed Care – PPO | Admitting: Family Medicine

## 2022-02-21 DIAGNOSIS — Z13220 Encounter for screening for lipoid disorders: Secondary | ICD-10-CM

## 2022-02-21 DIAGNOSIS — E559 Vitamin D deficiency, unspecified: Secondary | ICD-10-CM

## 2022-02-21 DIAGNOSIS — E039 Hypothyroidism, unspecified: Secondary | ICD-10-CM

## 2022-02-21 DIAGNOSIS — Z131 Encounter for screening for diabetes mellitus: Secondary | ICD-10-CM

## 2022-02-21 DIAGNOSIS — Z0000 Encounter for general adult medical examination without abnormal findings: Secondary | ICD-10-CM

## 2022-02-21 DIAGNOSIS — R7303 Prediabetes: Secondary | ICD-10-CM

## 2022-02-22 ENCOUNTER — Other Ambulatory Visit: Payer: Self-pay | Admitting: Obstetrics and Gynecology

## 2022-02-22 DIAGNOSIS — E039 Hypothyroidism, unspecified: Secondary | ICD-10-CM

## 2022-02-22 LAB — T4, FREE: Free T4: 1.24 ng/dL (ref 0.82–1.77)

## 2022-02-22 LAB — COMPREHENSIVE METABOLIC PANEL
ALT: 27 IU/L (ref 0–32)
AST: 13 IU/L (ref 0–40)
Albumin/Globulin Ratio: 1.6 (ref 1.2–2.2)
Albumin: 4.5 g/dL (ref 3.8–4.8)
Alkaline Phosphatase: 89 IU/L (ref 44–121)
BUN/Creatinine Ratio: 16 (ref 9–23)
BUN: 11 mg/dL (ref 6–24)
Bilirubin Total: 0.3 mg/dL (ref 0.0–1.2)
CO2: 22 mmol/L (ref 20–29)
Calcium: 9.7 mg/dL (ref 8.7–10.2)
Chloride: 104 mmol/L (ref 96–106)
Creatinine, Ser: 0.69 mg/dL (ref 0.57–1.00)
Globulin, Total: 2.8 g/dL (ref 1.5–4.5)
Glucose: 101 mg/dL — ABNORMAL HIGH (ref 70–99)
Potassium: 4.5 mmol/L (ref 3.5–5.2)
Sodium: 142 mmol/L (ref 134–144)
Total Protein: 7.3 g/dL (ref 6.0–8.5)
eGFR: 106 mL/min/{1.73_m2} (ref >59)

## 2022-02-22 LAB — VITAMIN D 25 HYDROXY (VIT D DEFICIENCY, FRACTURES): Vit D, 25-Hydroxy: 28.1 ng/mL — ABNORMAL LOW (ref 30.0–100.0)

## 2022-02-22 LAB — LIPID PANEL
Chol/HDL Ratio: 5.5 ratio — ABNORMAL HIGH (ref 0.0–4.4)
Cholesterol, Total: 210 mg/dL — ABNORMAL HIGH (ref 100–199)
HDL: 38 mg/dL — ABNORMAL LOW (ref >39)
LDL Chol Calc (NIH): 147 mg/dL — ABNORMAL HIGH (ref 0–99)
Triglycerides: 139 mg/dL (ref 0–149)
VLDL Cholesterol Cal: 25 mg/dL (ref 5–40)

## 2022-02-22 LAB — HEMOGLOBIN A1C
Est. average glucose Bld gHb Est-mCnc: 128 mg/dL
Hgb A1c MFr Bld: 6.1 % — ABNORMAL HIGH (ref 4.8–5.6)

## 2022-02-22 LAB — TSH: TSH: 0.576 u[IU]/mL (ref 0.450–4.500)

## 2022-02-22 MED ORDER — LEVOTHYROXINE SODIUM 88 MCG PO TABS: ORAL_TABLET | ORAL | 1 refills | Status: AC

## 2022-02-22 NOTE — Progress Notes (Signed)
Rx RF levo 88 mcg daily. Recheck labs in 6 months.

## 2022-03-05 ENCOUNTER — Encounter: Payer: Self-pay | Admitting: Gastroenterology

## 2022-03-14 DIAGNOSIS — K760 Fatty (change of) liver, not elsewhere classified: Secondary | ICD-10-CM | POA: Insufficient documentation

## 2022-03-17 ENCOUNTER — Encounter: Payer: Self-pay | Admitting: Gastroenterology

## 2022-03-17 ENCOUNTER — Ambulatory Visit: Payer: BC Managed Care – PPO | Admitting: Anesthesiology

## 2022-03-17 ENCOUNTER — Other Ambulatory Visit: Payer: Self-pay

## 2022-03-17 ENCOUNTER — Ambulatory Visit
Admission: RE | Admit: 2022-03-17 | Discharge: 2022-03-17 | Disposition: A | Discharge status: Home / Self Care | Payer: BC Managed Care – PPO | Attending: Gastroenterology | Admitting: Gastroenterology

## 2022-03-17 ENCOUNTER — Encounter
Admission: RE | Disposition: A | Discharge status: Home / Self Care | Payer: Self-pay | Source: Home / Self Care | Attending: Gastroenterology

## 2022-03-17 DIAGNOSIS — Z1211 Encounter for screening for malignant neoplasm of colon: Secondary | ICD-10-CM

## 2022-03-17 DIAGNOSIS — K5730 Diverticulosis of large intestine without perforation or abscess without bleeding: Secondary | ICD-10-CM | POA: Insufficient documentation

## 2022-03-17 DIAGNOSIS — K640 First degree hemorrhoids: Secondary | ICD-10-CM | POA: Diagnosis not present

## 2022-03-17 DIAGNOSIS — D125 Benign neoplasm of sigmoid colon: Secondary | ICD-10-CM | POA: Diagnosis not present

## 2022-03-17 DIAGNOSIS — K635 Polyp of colon: Secondary | ICD-10-CM | POA: Diagnosis not present

## 2022-03-17 HISTORY — DX: Hypothyroidism, unspecified: E03.9

## 2022-03-17 HISTORY — PX: COLONOSCOPY: SHX5424

## 2022-03-17 HISTORY — DX: Fatty (change of) liver, not elsewhere classified: K76.0

## 2022-03-17 HISTORY — DX: Prediabetes: R73.03

## 2022-03-17 SURGERY — COLONOSCOPY
Anesthesia: General | Site: Rectum

## 2022-03-17 MED ORDER — MEPERIDINE HCL 25 MG/ML IJ SOLN: 6.2500 mg | INTRAMUSCULAR | Status: DC | PRN

## 2022-03-17 MED ORDER — FENTANYL CITRATE PF 50 MCG/ML IJ SOSY: 25.0000 ug | PREFILLED_SYRINGE | INTRAMUSCULAR | Status: DC | PRN

## 2022-03-17 MED ORDER — OXYCODONE HCL 5 MG PO TABS: 5.0000 mg | ORAL_TABLET | Freq: Once | ORAL | Status: DC | PRN

## 2022-03-17 MED ORDER — LACTATED RINGERS IV SOLN: INTRAVENOUS | Status: DC

## 2022-03-17 MED ORDER — PROMETHAZINE HCL 25 MG/ML IJ SOLN: 6.2500 mg | INTRAMUSCULAR | Status: DC | PRN

## 2022-03-17 MED ORDER — SODIUM CHLORIDE 0.9 % IV SOLN: INTRAVENOUS | Status: DC

## 2022-03-17 MED ORDER — LIDOCAINE HCL (CARDIAC) PF 100 MG/5ML IV SOSY
PREFILLED_SYRINGE | INTRAVENOUS | Status: DC | PRN
  Administered 2022-03-17: 40 mg via INTRAVENOUS

## 2022-03-17 MED ORDER — OXYCODONE HCL 5 MG/5ML PO SOLN: 5.0000 mg | Freq: Once | ORAL | Status: DC | PRN

## 2022-03-17 MED ORDER — PROPOFOL 10 MG/ML IV BOLUS
INTRAVENOUS | Status: DC | PRN
  Administered 2022-03-17 (×2): 50 mg via INTRAVENOUS
  Administered 2022-03-17: 80 mg via INTRAVENOUS

## 2022-03-17 SURGICAL SUPPLY — 22 items
CLIP HMST 235XBRD CATH ROT (MISCELLANEOUS) IMPLANT
CLIP RESOLUTION 360 11X235 (MISCELLANEOUS)
ELECT REM PT RETURN 9FT ADLT (ELECTROSURGICAL)
ELECTRODE REM PT RTRN 9FT ADLT (ELECTROSURGICAL) IMPLANT
FORCEPS BIOP RAD 4 LRG CAP 4 (CUTTING FORCEPS) ×1 IMPLANT
GOWN CVR UNV OPN BCK APRN NK (MISCELLANEOUS) ×2 IMPLANT
GOWN ISOL THUMB LOOP REG UNIV (MISCELLANEOUS) ×4
INJECTOR VARIJECT VIN23 (MISCELLANEOUS) IMPLANT
KIT DEFENDO VALVE AND CONN (KITS) IMPLANT
KIT PRC NS LF DISP ENDO (KITS) ×1 IMPLANT
KIT PROCEDURE OLYMPUS (KITS) ×2
MANIFOLD NEPTUNE II (INSTRUMENTS) ×2 IMPLANT
MARKER SPOT ENDO TATTOO 5ML (MISCELLANEOUS) IMPLANT
PROBE APC STR FIRE (PROBE) IMPLANT
RETRIEVER NET ROTH 2.5X230 LF (MISCELLANEOUS) IMPLANT
SNARE COLD EXACTO (MISCELLANEOUS) IMPLANT
SNARE SHORT THROW 13M SML OVAL (MISCELLANEOUS) IMPLANT
SNARE SNG USE RND 15MM (INSTRUMENTS) IMPLANT
SPOT EX ENDOSCOPIC TATTOO (MISCELLANEOUS)
TRAP ETRAP POLY (MISCELLANEOUS) IMPLANT
VARIJECT INJECTOR VIN23 (MISCELLANEOUS)
WATER STERILE IRR 250ML POUR (IV SOLUTION) ×2 IMPLANT

## 2022-03-17 NOTE — Transfer of Care (Signed)
Immediate Anesthesia Transfer of Care Note  Patient: Hayley Mills  Procedure(s) Performed: COLONOSCOPY (Rectum)  Patient Location: PACU  Anesthesia Type: General  Level of Consciousness: awake, alert  and patient cooperative  Airway and Oxygen Therapy: Patient Spontanous Breathing and Patient connected to supplemental oxygen  Post-op Assessment: Post-op Vital signs reviewed, Patient's Cardiovascular Status Stable, Respiratory Function Stable, Patent Airway and No signs of Nausea or vomiting  Post-op Vital Signs: Reviewed and stable  Complications: No notable events documented.

## 2022-03-17 NOTE — Op Note (Signed)
[facility] Gastroenterology Patient Name: Hayley Mills Procedure Date: 03/17/2022 8:23 AM MRN: [id number] Account #: [id number] Date of Birth: September 06, 1971 Admit Type: Outpatient Age: 51 Room: [facility] OR ROOM 01 Gender: Female Note Status: Finalized Instrument Name: [id number] Procedure:             Colonoscopy Indications:           Screening for colorectal malignant neoplasm Providers:             Lucilla Lame MD, MD Medicines:             Propofol per Anesthesia Complications:         No immediate complications. Procedure:             Pre-Anesthesia Assessment:                        - Prior to the procedure, a History and Physical was                         performed, and patient medications and allergies were                         reviewed. The patient's tolerance of previous                         anesthesia was also reviewed. The risks and benefits                         of the procedure and the sedation options and risks                         were discussed with the patient. All questions were                         answered, and informed consent was obtained. Prior                         Anticoagulants: The patient has taken no previous                         anticoagulant or antiplatelet agents. ASA Grade                         Assessment: II - A patient with mild systemic disease.                         After reviewing the risks and benefits, the patient                         was deemed in satisfactory condition to undergo the                         procedure.                        After obtaining informed consent, the colonoscope was                         passed under direct vision. Throughout the procedure,  the patient's blood pressure, pulse, and oxygen                         saturations were monitored continuously. The                         Colonoscope was introduced through the anus and                          advanced to the the cecum, identified by appendiceal                         orifice and ileocecal valve. The colonoscopy was                         performed without difficulty. The patient tolerated                         the procedure well. The quality of the bowel                         preparation was excellent. Findings:      The perianal and digital rectal examinations were normal.      A 4 mm polyp was found in the sigmoid colon. The polyp was sessile. The       polyp was removed with a cold biopsy forceps. Resection and retrieval       were complete.      Multiple small-mouthed diverticula were found in the sigmoid colon.      Non-bleeding internal hemorrhoids were found during retroflexion. The       hemorrhoids were Grade I (internal hemorrhoids that do not prolapse). Impression:            - One 4 mm polyp in the sigmoid colon, removed with a                         cold biopsy forceps. Resected and retrieved.                        - Diverticulosis in the sigmoid colon.                        - Non-bleeding internal hemorrhoids. Recommendation:        - Discharge patient to home.                        - Resume previous diet.                        - Continue present medications.                        - Await pathology results.                        - If the pathology report reveals adenomatous tissue,                         then repeat the colonoscopy for surveillance in 7  years. Procedure Code(s):     --- Professional ---                        [phone], Colonoscopy, flexible; with biopsy, single or                         multiple Diagnosis Code(s):     --- Professional ---                        Z12.11, Encounter for screening for malignant neoplasm                         of colon                        K63.5, Polyp of colon CPT copyright 2019 American Medical Association. All rights reserved. The codes documented in this  report are preliminary and upon coder review may  be revised to meet current compliance requirements. Lucilla Lame MD, MD 03/17/2022 8:50:37 AM This report has been signed electronically. Number of Addenda: 0 Note Initiated On: 03/17/2022 8:23 AM Scope Withdrawal Time: 0 hours 8 minutes 50 seconds  Total Procedure Duration: 0 hours 11 minutes 36 seconds  Estimated Blood Loss:  Estimated blood loss: none.      [facility]

## 2022-03-17 NOTE — Anesthesia Preprocedure Evaluation (Addendum)
Anesthesia Evaluation  Patient identified by MRN, date of birth, ID band Patient awake    Reviewed: Allergy & Precautions, H&P , NPO status , Patient's Chart, lab work & pertinent test results, reviewed documented beta blocker date and time   Airway Mallampati: II  TM Distance: >3 FB Neck ROM: full    Dental no notable dental hx.    Pulmonary neg pulmonary ROS,    Pulmonary exam normal breath sounds clear to auscultation       Cardiovascular Exercise Tolerance: Good negative cardio ROS   Rhythm:regular Rate:Normal     Neuro/Psych negative neurological ROS  negative psych ROS   GI/Hepatic negative GI ROS, Neg liver ROS,   Endo/Other  negative endocrine ROS  Renal/GU negative Renal ROS  negative genitourinary   Musculoskeletal   Abdominal   Peds  Hematology negative hematology ROS (+)   Anesthesia Other Findings   Reproductive/Obstetrics negative OB ROS                             Anesthesia Physical Anesthesia Plan  ASA: 2  Anesthesia Plan: General   Post-op Pain Management:    Induction:   PONV Risk Score and Plan:   Airway Management Planned:   Additional Equipment:   Intra-op Plan:   Post-operative Plan:   Informed Consent: I have reviewed the patients History and Physical, chart, labs and discussed the procedure including the risks, benefits and alternatives for the proposed anesthesia with the patient or authorized representative who has indicated his/her understanding and acceptance.     Dental Advisory Given  Plan Discussed with: CRNA  Anesthesia Plan Comments:        Anesthesia Quick Evaluation

## 2022-03-17 NOTE — Anesthesia Postprocedure Evaluation (Signed)
Anesthesia Post Note  Patient: Hayley Mills  Procedure(s) Performed: COLONOSCOPY (Rectum)     Patient location during evaluation: PACU Anesthesia Type: General Level of consciousness: awake and alert Pain management: pain level controlled Vital Signs Assessment: post-procedure vital signs reviewed and stable Respiratory status: spontaneous breathing, nonlabored ventilation, respiratory function stable and patient connected to nasal cannula oxygen Cardiovascular status: blood pressure returned to baseline and stable Postop Assessment: no apparent nausea or vomiting Anesthetic complications: no   No notable events documented.  Kylan Liberati, Glade Stanford

## 2022-03-17 NOTE — H&P (Signed)
Lucilla Lame, MD [facility] [address], [place],  [address] Phone: [phone] Fax : [phone]  Primary Care Physician:  [facility], [place] Primary Gastroenterologist:  Dr. Allen Norris  Pre-Procedure History & Physical: HPI:  Hayley Mills is a 51 y.o. female is here for a screening colonoscopy.   Past Medical History:  Diagnosis Date   Anxiety    Diverticulitis    Fatty liver    Hypertriglyceridemia    Hypothyroidism    Pre-diabetes    Vitamin D deficiency     Past Surgical History:  Procedure Laterality Date   BREAST BIOPSY Left 04/28/2017   pseudoangiomatous stromal hyperplasia ([place])    CESAREAN SECTION     OTHER SURGICAL HISTORY  11/2021   Nasal surgery   TUBAL LIGATION      Prior to Admission medications   Medication Sig Start Date End Date Taking? Authorizing Provider  levothyroxine (SYNTHROID) 88 MCG tablet TAKE 1 TABLET BY MOUTH EVERY DAY BEFORE BREAKFAST 7/32/20  Yes Copland, Alicia B, PA-C  Na Sulfate-K Sulfate-Mg Sulf 17.5-3.13-1.6 GM/177ML SOLN Take by mouth as directed. 02/12/22  Yes [provider]  VITAMIN D PO Take by mouth 2 (two) times daily.   Yes [provider]  ondansetron (ZOFRAN-ODT) 4 MG disintegrating tablet Take 4 mg by mouth every 8 (eight) hours as needed. Patient not taking: Reported on 03/05/2022 01/06/22   [provider]  promethazine (PHENERGAN) 25 MG tablet PLEASE SEE ATTACHED FOR DETAILED DIRECTIONS Patient not taking: Reported on 03/05/2022 11/16/21   [provider]    Allergies as of 02/12/2022   (No Known Allergies)    Family History  Problem Relation Age of Onset   Diabetes Father    Hypertension Father    Kidney cancer Father    Bladder Cancer Father 46   Breast cancer Paternal Grandmother 5    Social History   Socioeconomic History   Marital status: Married    Spouse name: Not on file   Number of children: Not on file   Years of education: Not on  file   Highest education level: Not on file  Occupational History   Not on file  Tobacco Use   Smoking status: Never   Smokeless tobacco: Never  Vaping Use   Vaping Use: Never used  Substance and Sexual Activity   Alcohol use: No    Comment: Rare   Drug use: No   Sexual activity: Yes    Birth control/protection: Surgical  Other Topics Concern   Not on file  Social History Narrative   Not on file   Social Determinants of Health   Financial Resource Strain: Not on file  Food Insecurity: Not on file  Transportation Needs: Not on file  Physical Activity: Not on file  Stress: Not on file  Social Connections: Not on file  Intimate Partner Violence: Not on file    Review of Systems: See HPI, otherwise negative ROS  Physical Exam: BP 115/79   Pulse 65   Temp 97.9 F (36.6 C) (Temporal)   Resp (!) 22   Ht '5\' 8"'$  (1.727 m)   Wt 82.6 kg   LMP 09/15/2021 (Approximate)   SpO2 98%   BMI 27.67 kg/m  General:   Alert,  pleasant and cooperative in NAD Head:  Normocephalic and atraumatic. Neck:  Supple; no masses or thyromegaly. Lungs:  Clear throughout to auscultation.    Heart:  Regular rate and rhythm. Abdomen:  Soft, nontender and nondistended. Normal bowel  sounds, without guarding, and without rebound.   Neurologic:  Alert and  oriented x4;  grossly normal neurologically.  Impression/Plan: Hayley Mills is now here to undergo a screening colonoscopy.  Risks, benefits, and alternatives regarding colonoscopy have been reviewed with the patient.  Questions have been answered.  All parties agreeable.

## 2022-03-18 ENCOUNTER — Encounter: Payer: Self-pay | Admitting: Gastroenterology

## 2022-03-18 LAB — SURGICAL PATHOLOGY

## 2022-03-19 LAB — POCT PREGNANCY, URINE: Preg Test, Ur: NEGATIVE

## 2022-03-20 ENCOUNTER — Encounter: Payer: Self-pay | Admitting: Gastroenterology

## 2022-05-14 ENCOUNTER — Ambulatory Visit: Payer: BC Managed Care – PPO | Admitting: Gastroenterology

## 2022-09-16 DIAGNOSIS — E119 Type 2 diabetes mellitus without complications: Secondary | ICD-10-CM | POA: Insufficient documentation

## 2023-02-05 ENCOUNTER — Other Ambulatory Visit: Payer: Self-pay | Admitting: Obstetrics and Gynecology

## 2023-02-05 DIAGNOSIS — Z1231 Encounter for screening mammogram for malignant neoplasm of breast: Secondary | ICD-10-CM

## 2023-02-18 ENCOUNTER — Ambulatory Visit
Admission: RE | Admit: 2023-02-18 | Discharge: 2023-02-18 | Disposition: A | Discharge status: Home / Self Care | Payer: Managed Care, Other (non HMO) | Source: Ambulatory Visit | Attending: Obstetrics and Gynecology | Admitting: Obstetrics and Gynecology

## 2023-02-18 DIAGNOSIS — Z1231 Encounter for screening mammogram for malignant neoplasm of breast: Secondary | ICD-10-CM | POA: Insufficient documentation

## 2023-03-10 ENCOUNTER — Other Ambulatory Visit: Payer: Self-pay | Admitting: Obstetrics and Gynecology

## 2023-03-10 DIAGNOSIS — Z780 Asymptomatic menopausal state: Secondary | ICD-10-CM

## 2023-03-10 NOTE — Progress Notes (Signed)
Lab orders for mood changes. Pt wants hormones checked, no vasomotor sx; is exercising, doesn't want anxiety/mood meds. Has hypothyroidism and while TSH WNL, level is 4.866 on levo 88 mcg. Suggested she increase levo to 2 tabs on Sun since most women feel better with it closer to 1-2.0. Pt declines that, has other provider managing it; just wants labs.

## 2023-05-07 ENCOUNTER — Ambulatory Visit: Payer: Managed Care, Other (non HMO) | Admitting: Obstetrics and Gynecology

## 2023-05-07 ENCOUNTER — Encounter: Payer: Self-pay | Admitting: Obstetrics and Gynecology

## 2023-05-07 VITALS — BP 106/80 | Ht 68.0 in | Wt 178.0 lb

## 2023-05-07 DIAGNOSIS — Z01419 Encounter for gynecological examination (general) (routine) without abnormal findings: Secondary | ICD-10-CM | POA: Diagnosis not present

## 2023-05-07 DIAGNOSIS — N941 Unspecified dyspareunia: Secondary | ICD-10-CM

## 2023-05-07 DIAGNOSIS — Z780 Asymptomatic menopausal state: Secondary | ICD-10-CM

## 2023-05-07 DIAGNOSIS — Z1231 Encounter for screening mammogram for malignant neoplasm of breast: Secondary | ICD-10-CM

## 2023-05-07 NOTE — Patient Instructions (Signed)
I value your feedback and you entrusting us with your care. If you get a [facility] patient survey, I would appreciate you taking the time to let us know about your experience today. Thank you! ? ? ?

## 2023-05-07 NOTE — Progress Notes (Signed)
PCP: [facility]   Chief Complaint  Patient presents with   Gynecologic Exam    No concerns    HPI:      [name] is a 52 y.o. [phone] whose LMP was Patient's last menstrual period was 09/15/2021 (approximate)., presents today for her annual examination.  Her menses have been absent for over a year. No PMB. No vasomotor sx. Was having mood changes but they have improved. Labs with PCP 7/24 confirmed menopause. Pt taking provitalize. Started ozempic as well for type 2 DM with wt loss/feels better.  Thyroid followed by PCP.   Sex activity: single partner, contraception - tubal ligation. She does have some pain/burning during. Feels like "indian sunburn". Doesn't notice dryness, isn't doing lubricants. Husband uses scented manly soap.   Last Pap: 02/01/21  Results were: no abnormalities /neg HPV DNA.   Last mammogram: 02/18/23 Results were: normal--routine follow-up in 12 months There is a FH of breast cancer in her PGM, genetic testing not indicated. There is no FH of ovarian cancer. The patient does do self-breast exams.  Colonoscopy: 7/23 at [facility] with polyp; pt unsure when repeat due.    Tobacco use: The patient denies current or previous tobacco use. Alcohol use: none No drug use Exercise: mod active  She does get adequate calcium and Vitamin D in her diet. Hx of Vit D deficiency.   Labs with PCP  Patient Active Problem List   Diagnosis Date Noted   Screening for colon cancer    Polyp of sigmoid colon    Spider bite 03/25/2020   Black widow spider bite 03/25/2020   Vitamin D deficiency 09/16/2018   Overweight (BMI 25.0-29.9) 08/12/2017   Hypothyroidism 08/12/2017   Left lower quadrant pain 04/29/2017   Weight gain 04/29/2017   BMI 31.0-31.9,adult 04/29/2017    Past Surgical History:  Procedure Laterality Date   BREAST BIOPSY Left 04/28/2017   pseudoangiomatous stromal hyperplasia ([facility])    CESAREAN SECTION     COLONOSCOPY  N/A 03/17/2022   Procedure: COLONOSCOPY;  Surgeon: Midge Minium, MD;  Location: [facility];  Service: Endoscopy;  Laterality: N/A;   OTHER SURGICAL HISTORY  11/2021   Nasal surgery   TUBAL LIGATION      Family History  Problem Relation Age of Onset   Diabetes Father    Hypertension Father    Kidney cancer Father    Bladder Cancer Father 36   Breast cancer Paternal Grandmother 75    Social History   Socioeconomic History   Marital status: Married    Spouse name: Not on file   Number of children: Not on file   Years of education: Not on file   Highest education level: Not on file  Occupational History   Not on file  Tobacco Use   Smoking status: Never   Smokeless tobacco: Never  Vaping Use   Vaping status: Never Used  Substance and Sexual Activity   Alcohol use: No   Drug use: No   Sexual activity: Yes    Birth control/protection: Surgical    Comment: Tubal Ligation  Other Topics Concern   Not on file  Social History Narrative   Not on file   Social Determinants of Health   Financial Resource Strain: Not on file  Food Insecurity: Not on file  Transportation Needs: Not on file  Physical Activity: Not on file  Stress: Not on file  Social Connections: Not on file  Intimate Partner Violence: Not  on file     Current Outpatient Medications:    levothyroxine (SYNTHROID) 88 MCG tablet, TAKE 1 TABLET BY MOUTH EVERY DAY BEFORE BREAKFAST, Disp: 90 tablet, Rfl: 1   OZEMPIC, 1 MG/DOSE, 4 MG/3ML SOPN, SMARTSIG:0.75 Milliliter(s) SUB-Q Once a Week, Disp: , Rfl:    VITAMIN D PO, Take by mouth 2 (two) times daily., Disp: , Rfl:      ROS:  Review of Systems  Constitutional:  Negative for fatigue, fever and unexpected weight change.  Respiratory:  Negative for cough, shortness of breath and wheezing.   Cardiovascular:  Negative for chest pain, palpitations and leg swelling.  Gastrointestinal:  Negative for blood in stool, constipation, diarrhea, nausea and  vomiting.  Endocrine: Negative for cold intolerance, heat intolerance and polyuria.  Genitourinary:  Positive for dyspareunia. Negative for dysuria, flank pain, frequency, genital sores, hematuria, menstrual problem, pelvic pain, urgency, vaginal bleeding, vaginal discharge and vaginal pain.  Musculoskeletal:  Negative for back pain, joint swelling and myalgias.  Skin:  Negative for rash.  Neurological:  Negative for dizziness, syncope, light-headedness, numbness and headaches.  Hematological:  Negative for adenopathy.  Psychiatric/Behavioral:  Negative for agitation, confusion, sleep disturbance and suicidal ideas. The patient is not nervous/anxious.    BREAST: No symptoms    Objective: BP 106/80   Ht 5\' 8"  (1.727 m)   Wt 178 lb (80.7 kg)   LMP 09/15/2021 (Approximate)   BMI 27.06 kg/m    Physical Exam Constitutional:      Appearance: She is well-developed.  Genitourinary:     Vulva normal.     Right Labia: No rash, tenderness or lesions.    Left Labia: No tenderness, lesions or rash.    No vaginal discharge, erythema or tenderness.      Right Adnexa: not tender and no mass present.    Left Adnexa: not tender and no mass present.    No cervical friability or polyp.     Uterus is not enlarged or tender.  Breasts:    Right: No mass, nipple discharge, skin change or tenderness.     Left: No mass, nipple discharge, skin change or tenderness.  Neck:     Thyroid: No thyromegaly.  Cardiovascular:     Rate and Rhythm: Normal rate and regular rhythm.     Heart sounds: Normal heart sounds. No murmur heard. Pulmonary:     Effort: Pulmonary effort is normal.     Breath sounds: Normal breath sounds.  Abdominal:     Palpations: Abdomen is soft.     Tenderness: There is no abdominal tenderness. There is no guarding or rebound.  Musculoskeletal:        General: Normal range of motion.     Cervical back: Normal range of motion.  Lymphadenopathy:     Cervical: No cervical  adenopathy.  Neurological:     General: No focal deficit present.     Mental Status: She is alert and oriented to person, place, and time.     Cranial Nerves: No cranial nerve deficit.  Skin:    General: Skin is warm and dry.  Psychiatric:        Mood and Affect: Mood normal.        Behavior: Behavior normal.        Thought Content: Thought content normal.        Judgment: Judgment normal.  Vitals reviewed.     Assessment/Plan:  Encounter for annual routine gynecological examination  Encounter for screening mammogram for malignant neoplasm  of breast; pt current on mammo  Dyspareunia in female--husband to change to dove sens skin soap, try lubricants. If sx persist, will add vag ERT.   Menopause--f/u prn PMB.           GYN counsel breast self exam, mammography screening, menopause, adequate intake of calcium and vitamin D, diet and exercise    F/U  Return in about 1 year (around 05/06/2024).  Taygen Newsome B. Aleicia Kenagy, PA-C 05/07/2023 3:14 PM

## 2023-10-06 DIAGNOSIS — R531 Weakness: Secondary | ICD-10-CM | POA: Insufficient documentation

## 2023-10-29 ENCOUNTER — Telehealth: Payer: Self-pay

## 2023-10-29 DIAGNOSIS — N941 Unspecified dyspareunia: Secondary | ICD-10-CM

## 2023-10-29 DIAGNOSIS — Z98891 History of uterine scar from previous surgery: Secondary | ICD-10-CM | POA: Insufficient documentation

## 2023-10-29 DIAGNOSIS — Z9089 Acquired absence of other organs: Secondary | ICD-10-CM | POA: Insufficient documentation

## 2023-10-29 DIAGNOSIS — Z780 Asymptomatic menopausal state: Secondary | ICD-10-CM

## 2023-10-29 MED ORDER — ESTRADIOL 0.1 MG/GM VA CREA
TOPICAL_CREAM | VAGINAL | 1 refills | Status: AC
Start: 2023-10-29 — End: ?

## 2023-10-29 NOTE — Telephone Encounter (Signed)
Rx vag ERT eRxd. Insert 1 g nightly for 7 nights and then 1-2 times wkly as maintenance (directions on Rx). Also put a small amt externally at vaginal opening with finger when using applicator. Needs to use lubricants regardless; water-based OTC like KY/astroglide or coconut oil found in cooking aisle of grocery. F/u prn.

## 2023-10-29 NOTE — Telephone Encounter (Signed)
Patient advised per last visit 04/2023, she has tried different soaps (monitored spouse use of different soaps) and this has not helped issue of pain with intercourse. She feels she has tiny tears at entrance of vagina, feels like it's due to breaking down of tissue due to age. Requesting rx to help with this.  05/07/23 visit note plan: Dyspareunia in female--husband to change to dove sens skin soap, try lubricants. If sx persist, will add vag ERT.

## 2023-10-29 NOTE — Telephone Encounter (Signed)
 Called pt, no answer, LVMTRC.

## 2023-11-03 NOTE — Telephone Encounter (Signed)
 Called pt, no answer, LVMTRC.

## 2023-11-05 NOTE — Telephone Encounter (Signed)
 Called pt, no answer, LVMTRC.

## 2023-11-24 ENCOUNTER — Ambulatory Visit: Admitting: Podiatry

## 2023-11-24 DIAGNOSIS — B351 Tinea unguium: Secondary | ICD-10-CM

## 2023-11-24 DIAGNOSIS — Z79899 Other long term (current) drug therapy: Secondary | ICD-10-CM

## 2023-11-24 MED ORDER — TERBINAFINE HCL 250 MG PO TABS: 250.0000 mg | ORAL_TABLET | Freq: Every day | ORAL | 0 refills | Status: DC

## 2023-11-24 NOTE — Progress Notes (Signed)
 Subjective:  Patient ID: Hayley Mills, female    DOB: [birth year],  MRN: [id number]  Chief Complaint  Patient presents with   Diabetes    Bilateral nail fungus in great toes     53 y.o. female presents with the above complaint.  Patient presents for bilateral hallux thickened and onychodystrophy mycotic toenails x 2.  Patient states that has been present for quite some time she wants to discuss treatment options for nail fungus she has not seen MRIs prior to seeing me denies any other acute complaints.   Review of Systems: Negative except as noted in the HPI. Denies N/V/F/Ch.  Past Medical History:  Diagnosis Date   Anxiety    Diverticulitis    Fatty liver    Hypertriglyceridemia    Hypothyroidism    Pre-diabetes    Vitamin D deficiency     Current Outpatient Medications:    amoxicillin-clavulanate (AUGMENTIN) 875-125 MG tablet, Take 1 tablet by mouth 2 (two) times daily., Disp: , Rfl:    aspirin 81 MG chewable tablet, Chew by mouth., Disp: , Rfl:    atorvastatin (LIPITOR) 80 MG tablet, Take by mouth., Disp: , Rfl:    ciprofloxacin (CIPRO) 500 MG tablet, SMARTSIG:1 Tablet(s) By Mouth Every 12 Hours, Disp: , Rfl:    fluconazole (DIFLUCAN) 150 MG tablet, Take 150 mg by mouth once., Disp: , Rfl:    metroNIDAZOLE (FLAGYL) 500 MG tablet, SMARTSIG:1 Tablet(s) By Mouth Every 12 Hours, Disp: , Rfl:    predniSONE (DELTASONE) 20 MG tablet, Take 20 mg by mouth 2 (two) times daily., Disp: , Rfl:    promethazine-dextromethorphan (PROMETHAZINE-DM) 6.25-15 MG/5ML syrup, Take 5 mLs by mouth every 6 (six) hours as needed., Disp: , Rfl:    Cholecalciferol 50 MCG (2000 UT) CAPS, Take by mouth., Disp: , Rfl:    cyanocobalamin (VITAMIN B12) 1000 MCG tablet, Take by mouth., Disp: , Rfl:    estradiol (ESTRACE VAGINAL) 0.1 MG/GM vaginal cream, Insert 1 g vaginally nightly for 1 week, then once wkly as maintenance, Disp: 42.5 g, Rfl: 1   levothyroxine (SYNTHROID) 88 MCG tablet, TAKE 1  TABLET BY MOUTH EVERY DAY BEFORE BREAKFAST, Disp: 90 tablet, Rfl: 1   OZEMPIC, 1 MG/DOSE, 4 MG/3ML SOPN, SMARTSIG:0.75 Milliliter(s) SUB-Q Once a Week, Disp: , Rfl:    VITAMIN D PO, Take by mouth 2 (two) times daily., Disp: , Rfl:   Social History   Tobacco Use  Smoking Status Never  Smokeless Tobacco Never    No Known Allergies Objective:  There were no vitals filed for this visit. There is no height or weight on file to calculate BMI. Constitutional Well developed. Well nourished.  Vascular Dorsalis pedis pulses palpable bilaterally. Posterior tibial pulses palpable bilaterally. Capillary refill normal to all digits.  No cyanosis or clubbing noted. Pedal hair growth normal.  Neurologic Normal speech. Oriented to person, place, and time. Epicritic sensation to light touch grossly present bilaterally.  Dermatologic Nails thickened elongated dystrophic mycotic toenails x 2 bilateral hallux Skin within normal limits  Orthopedic: Normal joint ROM without pain or crepitus bilaterally. No visible deformities. No bony tenderness.   Radiographs: None Assessment:   1. Long-term use of high-risk medication   2. Nail fungus   3. Onychomycosis due to dermatophyte    Plan:  Patient was evaluated and treated and all questions answered.  Bilateral hallux onychomycosis -Educated the patient on the etiology of onychomycosis and various treatment options associated with improving the fungal load.  I explained to the patient  that there is 3 treatment options available to treat the onychomycosis including topical, p.o., laser treatment.  Patient elected to undergo p.o. options with Lamisil/terbinafine therapy.  In order for me to start the medication therapy, I explained to the patient the importance of evaluating the liver and obtaining the liver function test.  Once the liver function test comes back normal I will start him on 59-month course of Lamisil therapy.  Patient understood all risk  and would like to proceed with Lamisil therapy.  I have asked the patient to immediately stop the Lamisil therapy if she has any reactions to it and call the office or go to the emergency room right away.  Patient states understanding   No follow-ups on file.

## 2024-02-25 ENCOUNTER — Other Ambulatory Visit: Payer: Self-pay | Admitting: Podiatry

## 2024-04-18 ENCOUNTER — Other Ambulatory Visit: Payer: Self-pay | Admitting: Obstetrics and Gynecology

## 2024-04-18 DIAGNOSIS — Z1231 Encounter for screening mammogram for malignant neoplasm of breast: Secondary | ICD-10-CM

## 2024-04-19 ENCOUNTER — Ambulatory Visit
Admission: RE | Admit: 2024-04-19 | Discharge: 2024-04-19 | Disposition: A | Discharge status: Home / Self Care | Source: Ambulatory Visit | Attending: Obstetrics and Gynecology | Admitting: Obstetrics and Gynecology

## 2024-04-19 DIAGNOSIS — Z1231 Encounter for screening mammogram for malignant neoplasm of breast: Secondary | ICD-10-CM | POA: Diagnosis present

## 2024-07-04 NOTE — Progress Notes (Unsigned)
 PCP: [facility]   No chief complaint on file.   HPI:      [name] is a 53 y.o. [phone] whose LMP was Patient's last menstrual period was 09/15/2021 (approximate)., presents today for her annual examination.  Her menses have been absent for over a year. No PMB. No vasomotor sx. Was having mood changes but they have improved. Labs with PCP 7/24 confirmed menopause. Pt taking provitalize. Started ozempic as well for type 2 DM with wt loss/feels better.  Thyroid followed by PCP.   Sex activity: single partner, contraception - tubal ligation. She does have some pain/burning during. Feels like indian sunburn. Doesn't notice dryness, isn't doing lubricants. Husband uses scented manly soap.   Last Pap: 02/01/21  Results were: no abnormalities /neg HPV DNA.   Last mammogram: 04/19/24 Results were: normal--routine follow-up in 12 months There is a FH of breast cancer in her PGM, genetic testing not indicated. There is no FH of ovarian cancer. The patient does do self-breast exams.  Colonoscopy: 7/23 at [facility] with polyp; repeat due after 10 yrs.   Tobacco use: The patient denies current or previous tobacco use. Alcohol use: none No drug use Exercise: mod active  She does get adequate calcium and Vitamin D  in her diet. Hx of Vit D deficiency.   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   Spider bite 03/25/2020   Black widow spider bite 03/25/2020   Vitamin D  deficiency 09/16/2018   Overweight (BMI 25.0-29.9) 08/12/2017   Hypothyroidism 08/12/2017   Left lower quadrant pain 04/29/2017    Weight gain 04/29/2017   BMI 31.0-31.9,adult 04/29/2017   Abdominal pain 10/29/2011    Past Surgical History:  Procedure Laterality Date   BREAST BIOPSY Left 04/28/2017   pseudoangiomatous stromal hyperplasia (PASH)    CESAREAN SECTION     COLONOSCOPY N/A 03/17/2022   Procedure: COLONOSCOPY;  Surgeon: Jinny Carmine, MD;  Location: [facility];  Service: Endoscopy;  Laterality: N/A;   OTHER SURGICAL HISTORY  11/2021   Nasal surgery   TUBAL LIGATION      Family History  Problem Relation Age of Onset   Diabetes Father    Hypertension Father    Kidney cancer Father    Bladder Cancer Father 64   Breast cancer Paternal Grandmother 58    Social History   Socioeconomic History   Marital status: Married    Spouse name: Not on file   Number of children: Not on file   Years of education: Not on file   Highest education level: Not on file  Occupational History   Not on file  Tobacco Use   Smoking status: Never   Smokeless tobacco: Never  Vaping Use   Vaping status: Never Used  Substance and Sexual Activity   Alcohol use: No   Drug use: No   Sexual activity: Yes    Birth control/protection: Surgical    Comment: Tubal Ligation  Other Topics Concern   Not on file  Social History Narrative   Not on file   Social Drivers of Health   Financial Resource Strain:  Low Risk  (03/18/2024)   [facility]   Overall Financial Resource Strain (CARDIA)    Difficulty of Paying Living Expenses: Not very hard  Food Insecurity: No Food Insecurity (03/18/2024)   Received from [facility]   Hunger Vital Sign    Within the past 12 months, you worried that your food would run out before you got the money to buy more.: Never true    Within the past 12 months, the food you bought just didn't last and you didn't have money to get more.: Never true  Transportation Needs: No Transportation Needs (03/18/2024)   Received from [facility] - Transportation    In the past 12 months, has lack of transportation kept you from medical appointments or from getting medications?: No    Lack of Transportation (Non-Medical): No  Physical Activity: Not on file  Stress: Not on file  Social Connections: Not on file  Intimate Partner Violence: Not on file     Current Outpatient Medications:    amoxicillin-clavulanate (AUGMENTIN) 875-125 MG tablet, Take 1 tablet by mouth 2 (two) times daily., Disp: , Rfl:    aspirin 81 MG chewable tablet, Chew by mouth., Disp: , Rfl:    atorvastatin (LIPITOR) 80 MG tablet, Take by mouth., Disp: , Rfl:    Cholecalciferol 50 MCG (2000 UT) CAPS, Take by mouth., Disp: , Rfl:    ciprofloxacin  (CIPRO ) 500 MG tablet, SMARTSIG:1 Tablet(s) By Mouth Every 12 Hours, Disp: , Rfl:    cyanocobalamin (VITAMIN B12) 1000 MCG tablet, Take by mouth., Disp: , Rfl:    estradiol  (ESTRACE  VAGINAL) 0.1 MG/GM vaginal cream, Insert 1 g vaginally nightly for 1 week, then once wkly as maintenance, Disp: 42.5 g, Rfl: 1   fluconazole (DIFLUCAN) 150 MG tablet, Take 150 mg by mouth once., Disp: , Rfl:    levothyroxine  (SYNTHROID ) 88 MCG tablet, TAKE 1 TABLET BY MOUTH EVERY DAY BEFORE BREAKFAST, Disp: 90 tablet, Rfl: 1   metroNIDAZOLE  (FLAGYL ) 500 MG tablet, SMARTSIG:1 Tablet(s) By Mouth Every 12 Hours, Disp: , Rfl:    OZEMPIC, 1 MG/DOSE, 4 MG/3ML SOPN, SMARTSIG:0.75 Milliliter(s) SUB-Q Once a Week, Disp: , Rfl:    predniSONE (DELTASONE) 20 MG tablet, Take 20 mg by mouth 2 (two) times daily., Disp: , Rfl:    promethazine -dextromethorphan (PROMETHAZINE -DM) 6.25-15 MG/5ML syrup, Take 5 mLs by mouth every 6 (six) hours as needed., Disp: , Rfl:    terbinafine  (LAMISIL ) 250 MG tablet, Take 1 tablet (250 mg total) by mouth daily., Disp: 90 tablet, Rfl: 0   VITAMIN D  PO, Take by mouth 2 (two) times daily., Disp: , Rfl:      ROS:  Review of Systems  Constitutional:  Negative for fatigue, fever and unexpected weight  change.  Respiratory:  Negative for cough, shortness of breath and wheezing.   Cardiovascular:  Negative for chest pain, palpitations and leg swelling.  Gastrointestinal:  Negative for blood in stool, constipation, diarrhea, nausea and vomiting.  Endocrine: Negative for cold intolerance, heat intolerance and polyuria.  Genitourinary:  Positive for dyspareunia. Negative for dysuria, flank pain, frequency, genital sores, hematuria, menstrual problem, pelvic pain, urgency, vaginal bleeding, vaginal discharge and vaginal pain.  Musculoskeletal:  Negative for back pain, joint swelling and myalgias.  Skin:  Negative for rash.  Neurological:  Negative for dizziness, syncope, light-headedness, numbness and headaches.  Hematological:  Negative for adenopathy.  Psychiatric/Behavioral:  Negative for agitation, confusion, sleep disturbance and suicidal ideas.  The patient is not nervous/anxious.    BREAST: No symptoms    Objective: LMP 09/15/2021 (Approximate)    Physical Exam Constitutional:      Appearance: She is well-developed.  Genitourinary:     Vulva normal.     Right Labia: No rash, tenderness or lesions.    Left Labia: No tenderness, lesions or rash.    No vaginal discharge, erythema or tenderness.      Right Adnexa: not tender and no mass present.    Left Adnexa: not tender and no mass present.    No cervical friability or polyp.     Uterus is not enlarged or tender.  Breasts:    Right: No mass, nipple discharge, skin change or tenderness.     Left: No mass, nipple discharge, skin change or tenderness.  Neck:     Thyroid: No thyromegaly.  Cardiovascular:     Rate and Rhythm: Normal rate and regular rhythm.     Heart sounds: Normal heart sounds. No murmur heard. Pulmonary:     Effort: Pulmonary effort is normal.     Breath sounds: Normal breath sounds.  Abdominal:     Palpations: Abdomen is soft.     Tenderness: There is no abdominal tenderness. There is no guarding or  rebound.  Musculoskeletal:        General: Normal range of motion.     Cervical back: Normal range of motion.  Lymphadenopathy:     Cervical: No cervical adenopathy.  Neurological:     General: No focal deficit present.     Mental Status: She is alert and oriented to person, place, and time.     Cranial Nerves: No cranial nerve deficit.  Skin:    General: Skin is warm and dry.  Psychiatric:        Mood and Affect: Mood normal.        Behavior: Behavior normal.        Thought Content: Thought content normal.        Judgment: Judgment normal.  Vitals reviewed.     Assessment/Plan:  Encounter for annual routine gynecological examination  Encounter for screening mammogram for malignant neoplasm of breast; pt current on mammo  Dyspareunia in female--husband to change to dove sens skin soap, try lubricants. If sx persist, will add vag ERT.   Menopause--f/u prn PMB.           GYN counsel breast self exam, mammography screening, menopause, adequate intake of calcium and vitamin D , diet and exercise    F/U  No follow-ups on file.  Renn Dirocco B. Rodrecus Belsky, PA-C 07/04/2024 3:27 PM

## 2024-07-05 ENCOUNTER — Encounter: Payer: Self-pay | Admitting: Obstetrics and Gynecology

## 2024-07-05 ENCOUNTER — Other Ambulatory Visit (HOSPITAL_COMMUNITY)
Admission: RE | Admit: 2024-07-05 | Discharge: 2024-07-05 | Disposition: A | Discharge status: Home / Self Care | Source: Ambulatory Visit | Attending: Obstetrics and Gynecology | Admitting: Obstetrics and Gynecology

## 2024-07-05 ENCOUNTER — Ambulatory Visit (INDEPENDENT_AMBULATORY_CARE_PROVIDER_SITE_OTHER): Admitting: Obstetrics and Gynecology

## 2024-07-05 VITALS — BP 102/76 | Ht 68.0 in | Wt 171.0 lb

## 2024-07-05 DIAGNOSIS — N951 Menopausal and female climacteric states: Secondary | ICD-10-CM | POA: Diagnosis not present

## 2024-07-05 DIAGNOSIS — Z1151 Encounter for screening for human papillomavirus (HPV): Secondary | ICD-10-CM | POA: Insufficient documentation

## 2024-07-05 DIAGNOSIS — Z01419 Encounter for gynecological examination (general) (routine) without abnormal findings: Secondary | ICD-10-CM

## 2024-07-05 DIAGNOSIS — Z1231 Encounter for screening mammogram for malignant neoplasm of breast: Secondary | ICD-10-CM

## 2024-07-05 DIAGNOSIS — Z124 Encounter for screening for malignant neoplasm of cervix: Secondary | ICD-10-CM | POA: Diagnosis present

## 2024-07-05 DIAGNOSIS — Z79890 Hormone replacement therapy (postmenopausal): Secondary | ICD-10-CM

## 2024-07-05 NOTE — Patient Instructions (Signed)
 I value your feedback and you entrusting [place] with your care. If you get a [place] patient survey, I would appreciate you taking the time to let us know about your experience today. Thank you! ? ? ?

## 2024-07-08 LAB — CYTOLOGY - PAP
Comment: NEGATIVE
Diagnosis: NEGATIVE
High risk HPV: NEGATIVE
